# Patient Record
Sex: Male | Born: 1950 | Race: White | Hispanic: No | Marital: Married | Smoking: Never smoker
Health system: Southern US, Community
[De-identification: ages and names within clinical notes are randomized; demographics above are authoritative.]

## PROBLEM LIST (undated history)

## (undated) DIAGNOSIS — H409 Unspecified glaucoma: Secondary | ICD-10-CM

## (undated) DIAGNOSIS — T8859XA Other complications of anesthesia, initial encounter: Secondary | ICD-10-CM

## (undated) DIAGNOSIS — E785 Hyperlipidemia, unspecified: Secondary | ICD-10-CM

## (undated) DIAGNOSIS — I201 Angina pectoris with documented spasm: Secondary | ICD-10-CM

## (undated) DIAGNOSIS — M1990 Unspecified osteoarthritis, unspecified site: Secondary | ICD-10-CM

## (undated) DIAGNOSIS — K219 Gastro-esophageal reflux disease without esophagitis: Secondary | ICD-10-CM

## (undated) DIAGNOSIS — T4145XA Adverse effect of unspecified anesthetic, initial encounter: Secondary | ICD-10-CM

## (undated) DIAGNOSIS — I319 Disease of pericardium, unspecified: Secondary | ICD-10-CM

## (undated) DIAGNOSIS — T783XXA Angioneurotic edema, initial encounter: Secondary | ICD-10-CM

## (undated) HISTORY — PX: JOINT REPLACEMENT: SHX530

## (undated) HISTORY — PX: CHOLECYSTECTOMY: SHX55

## (undated) HISTORY — DX: Unspecified glaucoma: H40.9

## (undated) HISTORY — PX: SPINE SURGERY: SHX786

## (undated) HISTORY — DX: Angioneurotic edema, initial encounter: T78.3XXA

## (undated) HISTORY — DX: Hyperlipidemia, unspecified: E78.5

---

## 1979-06-30 HISTORY — PX: ANTERIOR FUSION CERVICAL SPINE: SUR626

## 2008-10-29 HISTORY — PX: OTHER SURGICAL HISTORY: SHX169

## 2010-10-29 DIAGNOSIS — I319 Disease of pericardium, unspecified: Secondary | ICD-10-CM

## 2010-10-29 HISTORY — DX: Disease of pericardium, unspecified: I31.9

## 2011-07-03 DIAGNOSIS — I201 Angina pectoris with documented spasm: Secondary | ICD-10-CM | POA: Insufficient documentation

## 2011-07-03 DIAGNOSIS — K219 Gastro-esophageal reflux disease without esophagitis: Secondary | ICD-10-CM | POA: Insufficient documentation

## 2011-10-30 HISTORY — PX: OTHER SURGICAL HISTORY: SHX169

## 2012-05-13 ENCOUNTER — Ambulatory Visit: Payer: Self-pay | Admitting: Orthopedic Surgery

## 2012-06-03 ENCOUNTER — Encounter (HOSPITAL_COMMUNITY): Payer: Self-pay | Admitting: Pharmacy Technician

## 2012-06-11 ENCOUNTER — Encounter (HOSPITAL_COMMUNITY): Payer: Self-pay

## 2012-06-11 ENCOUNTER — Encounter (HOSPITAL_COMMUNITY)
Admission: RE | Admit: 2012-06-11 | Discharge: 2012-06-11 | Disposition: A | Discharge status: Home / Self Care | Payer: Managed Care, Other (non HMO) | Source: Ambulatory Visit | Attending: Orthopedic Surgery | Admitting: Orthopedic Surgery

## 2012-06-11 ENCOUNTER — Ambulatory Visit (HOSPITAL_COMMUNITY)
Admission: RE | Admit: 2012-06-11 | Discharge: 2012-06-11 | Disposition: A | Discharge status: Home / Self Care | Payer: Managed Care, Other (non HMO) | Source: Ambulatory Visit | Attending: Orthopedic Surgery | Admitting: Orthopedic Surgery

## 2012-06-11 DIAGNOSIS — Z01818 Encounter for other preprocedural examination: Secondary | ICD-10-CM | POA: Insufficient documentation

## 2012-06-11 DIAGNOSIS — Z01812 Encounter for preprocedural laboratory examination: Secondary | ICD-10-CM | POA: Insufficient documentation

## 2012-06-11 DIAGNOSIS — E119 Type 2 diabetes mellitus without complications: Secondary | ICD-10-CM | POA: Insufficient documentation

## 2012-06-11 DIAGNOSIS — Z01810 Encounter for preprocedural cardiovascular examination: Secondary | ICD-10-CM | POA: Insufficient documentation

## 2012-06-11 HISTORY — DX: Angina pectoris with documented spasm: I20.1

## 2012-06-11 HISTORY — DX: Other complications of anesthesia, initial encounter: T88.59XA

## 2012-06-11 HISTORY — DX: Gastro-esophageal reflux disease without esophagitis: K21.9

## 2012-06-11 HISTORY — DX: Unspecified osteoarthritis, unspecified site: M19.90

## 2012-06-11 HISTORY — DX: Disease of pericardium, unspecified: I31.9

## 2012-06-11 HISTORY — DX: Adverse effect of unspecified anesthetic, initial encounter: T41.45XA

## 2012-06-11 LAB — CBC
HCT: 41.5 % (ref 39.0–52.0)
MCH: 28.1 pg (ref 26.0–34.0)
MCHC: 33.3 g/dL (ref 30.0–36.0)
MCV: 84.5 fL (ref 78.0–100.0)
RDW: 13.7 % (ref 11.5–15.5)

## 2012-06-11 LAB — URINALYSIS, ROUTINE W REFLEX MICROSCOPIC
Bilirubin Urine: NEGATIVE
Glucose, UA: NEGATIVE mg/dL
Hgb urine dipstick: NEGATIVE
Ketones, ur: NEGATIVE mg/dL
Leukocytes, UA: NEGATIVE
pH: 6.5 (ref 5.0–8.0)

## 2012-06-11 LAB — BASIC METABOLIC PANEL
BUN: 11 mg/dL (ref 6–23)
CO2: 28 mEq/L (ref 19–32)
Calcium: 9.3 mg/dL (ref 8.4–10.5)
Chloride: 99 mEq/L (ref 96–112)
Creatinine, Ser: 0.72 mg/dL (ref 0.50–1.35)
Glucose, Bld: 126 mg/dL — ABNORMAL HIGH (ref 70–99)

## 2012-06-11 NOTE — Patient Instructions (Signed)
YOUR SURGERY IS SCHEDULED ON:  Monday  8/19   AT 9:55 AM  REPORT TO [facility] AT:  7:30 AM      PHONE # FOR SHORT STAY IS [phone]  DO NOT EAT OR DRINK ANYTHING AFTER MIDNIGHT THE NIGHT BEFORE YOUR SURGERY.  YOU MAY BRUSH YOUR TEETH, RINSE OUT YOUR MOUTH--BUT NO WATER, NO FOOD, NO CHEWING GUM, NO MINTS, NO CANDIES, NO CHEWING TOBACCO.  PLEASE TAKE THE FOLLOWING MEDICATIONS THE AM OF YOUR SURGERY WITH A FEW SIPS OF WATER:  PROTONIX, VERAPAMIL    IF YOU USE INHALERS--USE YOUR INHALERS THE AM OF YOUR SURGERY AND BRING INHALERS TO THE HOSPITAL -TAKE TO SURGERY.    IF YOU ARE DIABETIC:  DO NOT TAKE ANY DIABETIC MEDICATIONS THE AM OF YOUR SURGERY.  IF YOU TAKE INSULIN IN THE EVENINGS--PLEASE ONLY TAKE 1/2 NORMAL EVENING DOSE THE NIGHT BEFORE YOUR SURGERY.  NO INSULIN THE AM OF YOUR SURGERY.  IF YOU HAVE SLEEP APNEA AND USE CPAP OR BIPAP--PLEASE BRING THE MASK --NOT THE MACHINE-NOT THE TUBING   -JUST THE MASK. DO NOT BRING VALUABLES, MONEY, CREDIT CARDS.  CONTACT LENS, DENTURES / PARTIALS, GLASSES SHOULD NOT BE WORN TO SURGERY AND IN MOST CASES-HEARING AIDS WILL NEED TO BE REMOVED.  BRING YOUR GLASSES CASE, ANY EQUIPMENT NEEDED FOR YOUR CONTACT LENS. FOR PATIENTS ADMITTED TO THE HOSPITAL--CHECK OUT TIME THE DAY OF DISCHARGE IS 11:00 AM.  ALL INPATIENT ROOMS ARE PRIVATE - WITH BATHROOM, TELEPHONE, TELEVISION AND WIFI INTERNET. IF YOU ARE BEING DISCHARGED THE SAME DAY OF YOUR SURGERY--YOU CAN NOT DRIVE YOURSELF HOME--AND SHOULD NOT GO HOME ALONE BY TAXI OR BUS.  NO DRIVING OR OPERATING MACHINERY FOR 24 HOURS FOLLOWING ANESTHESIA / PAIN MEDICATIONS.                            SPECIAL INSTRUCTIONS:  CHLORHEXIDINE SOAP SHOWER (other brand names are Betasept and Hibiclens ) PLEASE SHOWER WITH CHLORHEXIDINE THE NIGHT BEFORE YOUR SURGERY AND THE AM OF YOUR SURGERY. DO NOT USE CHLORHEXIDINE ON YOUR FACE OR PRIVATE AREAS--YOU MAY USE YOUR NORMAL SOAP THOSE AREAS AND YOUR NORMAL SHAMPOO.   WOMEN SHOULD AVOID SHAVING UNDER ARMS AND SHAVING LEGS 48 HOURS BEFORE USING CHLORHEXIDINE TO AVOID SKIN IRRITATION.  DO NOT USE IF ALLERGIC TO CHLORHEXIDINE.  PLEASE READ OVER ANY  FACT SHEETS THAT YOU WERE GIVEN: MRSA INFORMATION, BLOOD TRANSFUSION INFORMATION, INCENTIVE SPIROMETER INFORMATION.

## 2012-06-12 MED ORDER — TRANEXAMIC ACID 100 MG/ML IV SOLN: 15.0000 mg/kg | Freq: Once | INTRAVENOUS | Status: DC

## 2012-06-12 NOTE — H&P (Signed)
[name] [name] is an 61 y.o. male.    Chief Complaint:   Left knee pain and loosening of the partial prosthetic joint  HPI: Pt is a 61 y.o. male complaining of left knee pain since he had a partial knee replacement 3 years ago.  He states that original knee replacement was done in [place], [place] and the ain had continually increased as well as the feels of the knee wanting to given away.  X-rays in the clinic show left partial knee replacement.  A bone scan was also obtained with showed possible loosening of the femoral component as well as possible loose bodies on the lateral aspect.  Pt has tried various conservative treatments which have failed to alleviate their symptoms. Various options are discussed with the patient. Risks, benefits and expectations were discussed with the patient. Patient understand the risks, benefits and expectations and wishes to proceed with surgery.   PCP:  Bobby Rumpf, MD  D/C Plans:  Home with HHPT  Post-op Meds:   Rx given for ASA, Robaxin, Iron, Colace and MiraLax  Tranexamic Acid:   To be given  Decadron:   Not to be given - DM  PMH: Past Medical History  Diagnosis Date  . Complication of anesthesia     uvula swelling--day after surgery-could not swallow--required steroids--this was after knee surgery 2010  . Coronary artery spasm     diagnosed in the 7o's after chest pains--pt states heart cath negative--was given verapamil to take to prevent further spasms and pt states he has not had any other problems  . Pericarditis 2012    tx'd at [facility]--with motrin 800 mg--hospitalized over night--no problems since-  . Diabetes mellitus   . GERD (gastroesophageal reflux disease)   . Arthritis     some arthritis and djd left knee - s/p partial left knee replacement -now has pain left knee, and knee gives away    PSH: Past Surgical History  Procedure Date  . Left partial knee replacement 2010    surgery was at [facility] in [place], [place]  . Nasal septal  recontruction 2013  . Anterior fusion cervical spine 1980's    Social History:  reports that he has never smoked. He has never used smokeless tobacco. He reports that he does not drink alcohol or use illicit drugs.  Allergies:  No Known Allergies  Medications: No current facility-administered medications for this encounter.   Current Outpatient Prescriptions  Medication Sig Dispense Refill  . aspirin 81 MG chewable tablet Chew 81 mg by mouth every morning.      Marland Kitchen atorvastatin (LIPITOR) 10 MG tablet Take 10 mg by mouth every evening.       . insulin glargine (LANTUS) 100 UNIT/ML injection Inject 38 Units into the skin every morning.      Marland Kitchen lisinopril (PRINIVIL,ZESTRIL) 20 MG tablet Take 20 mg by mouth every morning.      . pantoprazole (PROTONIX) 40 MG tablet Take 40 mg by mouth every morning.      . pioglitazone (ACTOS) 45 MG tablet Take 45 mg by mouth every morning.      . sitaGLIPtan-metformin (JANUMET) 50-1000 MG per tablet Take 1 tablet by mouth 2 (two) times daily with a meal.      . verapamil (COVERA HS) 240 MG (CO) 24 hr tablet Take 240 mg by mouth every morning.         No results found for this or any previous visit (from the past 48 hour(s)). Dg Chest  2 View  06/11/2012  *RADIOLOGY REPORT*  Clinical Data: Preop for left knee surgery, diabetes  CHEST - 2 VIEW  Comparison: None.  Findings: The lungs are clear.  Mediastinal contours appear normal. The heart is borderline enlarged.  No acute bony abnormality is seen.  There is mild degenerative change in the lower thoracic spine.  IMPRESSION: No active lung disease.  Borderline cardiomegaly.  Original Report Authenticated By: Juline Patch, M.D.    ROS: Review of Systems  Constitutional: Negative.   HENT: Negative.   Eyes: Negative.   Respiratory: Negative.   Cardiovascular: Negative.   Gastrointestinal: Negative.   Genitourinary: Negative.   Musculoskeletal: Positive for myalgias and joint pain.  Skin: Negative.     Neurological: Negative.   Endo/Heme/Allergies: Negative.   Psychiatric/Behavioral: Negative.      Physical Exam: BP: 131/75 ; HR 72 ; Resp: 14 Physical Exam  Constitutional: He is oriented to person, place, and time and well-developed, well-nourished, and in no distress.  HENT:  Head: Normocephalic and atraumatic.  Nose: Nose normal.  Mouth/Throat: Oropharynx is clear and moist.  Eyes: Pupils are equal, round, and reactive to light.  Neck: Neck supple. No JVD present. No tracheal deviation present. No thyromegaly present.  Cardiovascular: Normal rate, regular rhythm, normal heart sounds and intact distal pulses.   Pulmonary/Chest: Effort normal and breath sounds normal. No respiratory distress. He has no wheezes. He has no rales. He exhibits no tenderness.  Abdominal: Soft. There is no tenderness. There is no guarding.  Musculoskeletal:       Left knee: He exhibits laceration (previous partial knee replacment scar) and bony tenderness. He exhibits normal range of motion, no swelling and no effusion. tenderness found.  Lymphadenopathy:    He has no cervical adenopathy.  Neurological: He is alert and oriented to person, place, and time.  Skin: Skin is warm and dry.  Psychiatric: Affect normal.     Assessment/Plan Assessment:  Left knee pain and loosening of the partial prosthetic joint  Plan: Patient will undergo a left knee arthroscopy with partial lateral menisectomy and a left partial knee revision-femoral component on 06/16/2012 per Dr. Charlann Boxer at [facility]. Risks benefits and expectation were discussed with the patient. Patient understand risks, benefits and expectation and wishes to proceed.   Anastasio Auerbach Raysha Tilmon   PAC  06/12/2012, 11:18 AM

## 2012-06-13 NOTE — Pre-Procedure Instructions (Addendum)
[facility] FAXED PT'S MOST RECENT CARDIOLOGY OFFICE NOTE 08/15/11 AND HIS STRESS ECHO REPORT 01/28/12 AND EKG REPORT 01/27/12  -REPORTS PLACED ON PT'S CHART. PT HAS CBC, BMET, PT, PTT, UA, EKG AND CXR DONE PREOP AT [facility] ON 06/11/12 --T/S WILL BE DRAWN DAY OF SURGERY.  PREOP TEACHING WAS DONE USING THE TEACH BACK METHOD. PT HAS A NOTE OF MEDICAL CLEARANCE FOR SURGERY FROM DR. CAREW THAT WAS FAXED BY DR. Nilsa Nutting OFFICE.--THE NOTE PLACED ON PT'S CHART

## 2012-06-16 ENCOUNTER — Encounter (HOSPITAL_COMMUNITY)
Admission: RE | Disposition: A | Discharge status: Home Health | Payer: Self-pay | Source: Ambulatory Visit | Attending: Orthopedic Surgery

## 2012-06-16 ENCOUNTER — Inpatient Hospital Stay (HOSPITAL_COMMUNITY)
Admission: RE | Admit: 2012-06-16 | Discharge: 2012-06-17 | DRG: 468 | Disposition: A | Discharge status: Home Health | Payer: Managed Care, Other (non HMO) | Source: Ambulatory Visit | Attending: Orthopedic Surgery | Admitting: Orthopedic Surgery

## 2012-06-16 ENCOUNTER — Encounter (HOSPITAL_COMMUNITY): Payer: Self-pay | Admitting: *Deleted

## 2012-06-16 ENCOUNTER — Ambulatory Visit (HOSPITAL_COMMUNITY): Payer: Managed Care, Other (non HMO) | Admitting: Anesthesiology

## 2012-06-16 ENCOUNTER — Encounter (HOSPITAL_COMMUNITY): Payer: Self-pay | Admitting: Anesthesiology

## 2012-06-16 ENCOUNTER — Ambulatory Visit (HOSPITAL_COMMUNITY): Payer: Managed Care, Other (non HMO)

## 2012-06-16 DIAGNOSIS — S83289A Other tear of lateral meniscus, current injury, unspecified knee, initial encounter: Secondary | ICD-10-CM | POA: Diagnosis present

## 2012-06-16 DIAGNOSIS — Z794 Long term (current) use of insulin: Secondary | ICD-10-CM

## 2012-06-16 DIAGNOSIS — Z96652 Presence of left artificial knee joint: Secondary | ICD-10-CM

## 2012-06-16 DIAGNOSIS — Z96659 Presence of unspecified artificial knee joint: Secondary | ICD-10-CM

## 2012-06-16 DIAGNOSIS — Z9889 Other specified postprocedural states: Secondary | ICD-10-CM

## 2012-06-16 DIAGNOSIS — K219 Gastro-esophageal reflux disease without esophagitis: Secondary | ICD-10-CM | POA: Diagnosis present

## 2012-06-16 DIAGNOSIS — M6750 Plica syndrome, unspecified knee: Secondary | ICD-10-CM | POA: Diagnosis present

## 2012-06-16 DIAGNOSIS — I209 Angina pectoris, unspecified: Secondary | ICD-10-CM | POA: Diagnosis present

## 2012-06-16 DIAGNOSIS — T84039A Mechanical loosening of unspecified internal prosthetic joint, initial encounter: Principal | ICD-10-CM | POA: Diagnosis present

## 2012-06-16 DIAGNOSIS — E119 Type 2 diabetes mellitus without complications: Secondary | ICD-10-CM | POA: Diagnosis present

## 2012-06-16 DIAGNOSIS — Z79899 Other long term (current) drug therapy: Secondary | ICD-10-CM

## 2012-06-16 DIAGNOSIS — IMO0002 Reserved for concepts with insufficient information to code with codable children: Secondary | ICD-10-CM | POA: Diagnosis present

## 2012-06-16 DIAGNOSIS — Y838 Other surgical procedures as the cause of abnormal reaction of the patient, or of later complication, without mention of misadventure at the time of the procedure: Secondary | ICD-10-CM | POA: Diagnosis present

## 2012-06-16 DIAGNOSIS — Z7982 Long term (current) use of aspirin: Secondary | ICD-10-CM

## 2012-06-16 DIAGNOSIS — M25569 Pain in unspecified knee: Secondary | ICD-10-CM | POA: Diagnosis present

## 2012-06-16 DIAGNOSIS — M1710 Unilateral primary osteoarthritis, unspecified knee: Secondary | ICD-10-CM | POA: Diagnosis present

## 2012-06-16 DIAGNOSIS — Z1883 Retained stone or crystalline fragments: Secondary | ICD-10-CM

## 2012-06-16 DIAGNOSIS — X58XXXA Exposure to other specified factors, initial encounter: Secondary | ICD-10-CM | POA: Diagnosis present

## 2012-06-16 HISTORY — PX: TOTAL KNEE REVISION: SHX996

## 2012-06-16 HISTORY — PX: FOREIGN BODY REMOVAL: SHX962

## 2012-06-16 LAB — GLUCOSE, CAPILLARY
Glucose-Capillary: 141 mg/dL — ABNORMAL HIGH (ref 70–99)
Glucose-Capillary: 173 mg/dL — ABNORMAL HIGH (ref 70–99)
Glucose-Capillary: 139 mg/dL — ABNORMAL HIGH (ref 70–99)

## 2012-06-16 LAB — TYPE AND SCREEN: ABO/RH(D): A NEG

## 2012-06-16 SURGERY — ARTHROSCOPY, KNEE, WITH LATERAL MENISCECTOMY
Anesthesia: General | Site: Knee | Laterality: Left | Wound class: Clean

## 2012-06-16 MED ORDER — VERAPAMIL HCL 240 MG (CO) PO TB24: 240.0000 mg | ORAL_TABLET | Freq: Every morning | ORAL | Status: DC

## 2012-06-16 MED ORDER — METHOCARBAMOL 100 MG/ML IJ SOLN
500.0000 mg | Freq: Four times a day (QID) | INTRAVENOUS | Status: DC | PRN
  Administered 2012-06-16: 500 mg via INTRAVENOUS
  Filled 2012-06-16: qty 5

## 2012-06-16 MED ORDER — HYDROMORPHONE HCL PF 1 MG/ML IJ SOLN: 0.5000 mg | INTRAMUSCULAR | Status: DC | PRN

## 2012-06-16 MED ORDER — DIPHENHYDRAMINE HCL 25 MG PO CAPS
25.0000 mg | ORAL_CAPSULE | Freq: Four times a day (QID) | ORAL | Status: DC | PRN
  Administered 2012-06-16 – 2012-06-17 (×2): 25 mg via ORAL
  Filled 2012-06-16 (×2): qty 1

## 2012-06-16 MED ORDER — CHLORHEXIDINE GLUCONATE 4 % EX LIQD
60.0000 mL | Freq: Once | CUTANEOUS | Status: DC
  Filled 2012-06-16: qty 60

## 2012-06-16 MED ORDER — VERAPAMIL HCL ER 240 MG PO TBCR
240.0000 mg | EXTENDED_RELEASE_TABLET | Freq: Every day | ORAL | Status: DC
  Administered 2012-06-17: 240 mg via ORAL
  Filled 2012-06-16: qty 1

## 2012-06-16 MED ORDER — METHOCARBAMOL 500 MG PO TABS
500.0000 mg | ORAL_TABLET | Freq: Four times a day (QID) | ORAL | Status: DC | PRN
  Administered 2012-06-16 – 2012-06-17 (×2): 500 mg via ORAL
  Filled 2012-06-16 (×2): qty 1

## 2012-06-16 MED ORDER — LINAGLIPTIN 5 MG PO TABS
5.0000 mg | ORAL_TABLET | Freq: Every day | ORAL | Status: DC
  Administered 2012-06-16 – 2012-06-17 (×2): 5 mg via ORAL
  Filled 2012-06-16 (×2): qty 1

## 2012-06-16 MED ORDER — KETOROLAC TROMETHAMINE 30 MG/ML IJ SOLN
INTRAMUSCULAR | Status: DC | PRN
  Administered 2012-06-16: 30 mg

## 2012-06-16 MED ORDER — KETOROLAC TROMETHAMINE 30 MG/ML IJ SOLN
INTRAMUSCULAR | Status: AC
  Filled 2012-06-16: qty 1

## 2012-06-16 MED ORDER — PROMETHAZINE HCL 25 MG/ML IJ SOLN: 6.2500 mg | INTRAMUSCULAR | Status: DC | PRN

## 2012-06-16 MED ORDER — LACTATED RINGERS IV SOLN
INTRAVENOUS | Status: DC | PRN
  Administered 2012-06-16 (×3): via INTRAVENOUS

## 2012-06-16 MED ORDER — INSULIN ASPART 100 UNIT/ML ~~LOC~~ SOLN
0.0000 [IU] | Freq: Three times a day (TID) | SUBCUTANEOUS | Status: DC
  Administered 2012-06-16: 2 [IU] via SUBCUTANEOUS
  Administered 2012-06-17: 3 [IU] via SUBCUTANEOUS

## 2012-06-16 MED ORDER — HYDROMORPHONE HCL PF 1 MG/ML IJ SOLN
INTRAMUSCULAR | Status: AC
  Filled 2012-06-16: qty 1

## 2012-06-16 MED ORDER — ACETAMINOPHEN 10 MG/ML IV SOLN
INTRAVENOUS | Status: AC
  Filled 2012-06-16: qty 100

## 2012-06-16 MED ORDER — NEOSTIGMINE METHYLSULFATE 1 MG/ML IJ SOLN
INTRAMUSCULAR | Status: DC | PRN
  Administered 2012-06-16: 3 mg via INTRAVENOUS

## 2012-06-16 MED ORDER — HYDROCODONE-ACETAMINOPHEN 7.5-325 MG PO TABS
1.0000 | ORAL_TABLET | ORAL | Status: DC
  Administered 2012-06-16 (×2): 2 via ORAL
  Administered 2012-06-16: 1 via ORAL
  Administered 2012-06-17 (×2): 2 via ORAL
  Filled 2012-06-16 (×4): qty 2
  Filled 2012-06-16: qty 1

## 2012-06-16 MED ORDER — LACTATED RINGERS IV SOLN: INTRAVENOUS | Status: DC

## 2012-06-16 MED ORDER — PIOGLITAZONE HCL 45 MG PO TABS
45.0000 mg | ORAL_TABLET | Freq: Every morning | ORAL | Status: DC
  Administered 2012-06-17: 45 mg via ORAL
  Filled 2012-06-16: qty 1

## 2012-06-16 MED ORDER — POLYETHYLENE GLYCOL 3350 17 G PO PACK: 17.0000 g | PACK | Freq: Two times a day (BID) | ORAL | Status: DC

## 2012-06-16 MED ORDER — ACETAMINOPHEN 10 MG/ML IV SOLN
INTRAVENOUS | Status: DC | PRN
  Administered 2012-06-16: 1000 mg via INTRAVENOUS

## 2012-06-16 MED ORDER — ATORVASTATIN CALCIUM 10 MG PO TABS
10.0000 mg | ORAL_TABLET | Freq: Every evening | ORAL | Status: DC
  Administered 2012-06-16: 10 mg via ORAL
  Filled 2012-06-16 (×2): qty 1

## 2012-06-16 MED ORDER — BUPIVACAINE-EPINEPHRINE PF 0.25-1:200000 % IJ SOLN
INTRAMUSCULAR | Status: DC | PRN
  Administered 2012-06-16: 50 mL

## 2012-06-16 MED ORDER — RIVAROXABAN 10 MG PO TABS
10.0000 mg | ORAL_TABLET | ORAL | Status: DC
  Administered 2012-06-17: 10 mg via ORAL
  Filled 2012-06-16 (×2): qty 1

## 2012-06-16 MED ORDER — PROPOFOL 10 MG/ML IV BOLUS
INTRAVENOUS | Status: DC | PRN
  Administered 2012-06-16: 200 mg via INTRAVENOUS

## 2012-06-16 MED ORDER — BISACODYL 10 MG RE SUPP: 10.0000 mg | Freq: Every day | RECTAL | Status: DC | PRN

## 2012-06-16 MED ORDER — LACTATED RINGERS IR SOLN
Status: DC | PRN
  Administered 2012-06-16: 9000 mL

## 2012-06-16 MED ORDER — METOCLOPRAMIDE HCL 10 MG PO TABS: 5.0000 mg | ORAL_TABLET | Freq: Three times a day (TID) | ORAL | Status: DC | PRN

## 2012-06-16 MED ORDER — PHENOL 1.4 % MT LIQD
1.0000 | OROMUCOSAL | Status: DC | PRN
  Filled 2012-06-16: qty 177

## 2012-06-16 MED ORDER — SODIUM CHLORIDE 0.9 % IV SOLN
INTRAVENOUS | Status: DC
  Administered 2012-06-16 – 2012-06-17 (×2): via INTRAVENOUS
  Filled 2012-06-16 (×4): qty 1000

## 2012-06-16 MED ORDER — CEFAZOLIN SODIUM-DEXTROSE 2-3 GM-% IV SOLR
2.0000 g | INTRAVENOUS | Status: AC
  Administered 2012-06-16: 2 g via INTRAVENOUS

## 2012-06-16 MED ORDER — CEFAZOLIN SODIUM-DEXTROSE 2-3 GM-% IV SOLR
2.0000 g | Freq: Four times a day (QID) | INTRAVENOUS | Status: AC
  Administered 2012-06-16 (×2): 2 g via INTRAVENOUS
  Filled 2012-06-16 (×2): qty 50

## 2012-06-16 MED ORDER — GLYCOPYRROLATE 0.2 MG/ML IJ SOLN
INTRAMUSCULAR | Status: DC | PRN
  Administered 2012-06-16: 0.4 mg via INTRAVENOUS

## 2012-06-16 MED ORDER — 0.9 % SODIUM CHLORIDE (POUR BTL) OPTIME
TOPICAL | Status: DC | PRN
  Administered 2012-06-16: 1000 mL

## 2012-06-16 MED ORDER — ONDANSETRON HCL 4 MG/2ML IJ SOLN
INTRAMUSCULAR | Status: DC | PRN
  Administered 2012-06-16: 4 mg via INTRAVENOUS

## 2012-06-16 MED ORDER — METOCLOPRAMIDE HCL 5 MG/ML IJ SOLN: 5.0000 mg | Freq: Three times a day (TID) | INTRAMUSCULAR | Status: DC | PRN

## 2012-06-16 MED ORDER — HYDROMORPHONE HCL PF 1 MG/ML IJ SOLN
INTRAMUSCULAR | Status: DC | PRN
  Administered 2012-06-16: 0.5 mg via INTRAVENOUS
  Administered 2012-06-16: 1 mg via INTRAVENOUS
  Administered 2012-06-16: 0.5 mg via INTRAVENOUS

## 2012-06-16 MED ORDER — FLEET ENEMA 7-19 GM/118ML RE ENEM: 1.0000 | ENEMA | Freq: Once | RECTAL | Status: AC | PRN

## 2012-06-16 MED ORDER — LIDOCAINE HCL (CARDIAC) 20 MG/ML IV SOLN
INTRAVENOUS | Status: DC | PRN
  Administered 2012-06-16: 100 mg via INTRAVENOUS

## 2012-06-16 MED ORDER — MIDAZOLAM HCL 5 MG/5ML IJ SOLN
INTRAMUSCULAR | Status: DC | PRN
  Administered 2012-06-16 (×2): 1 mg via INTRAVENOUS

## 2012-06-16 MED ORDER — BUPIVACAINE-EPINEPHRINE 0.25% -1:200000 IJ SOLN
INTRAMUSCULAR | Status: AC
  Filled 2012-06-16: qty 1

## 2012-06-16 MED ORDER — CEFAZOLIN SODIUM-DEXTROSE 2-3 GM-% IV SOLR
INTRAVENOUS | Status: AC
  Filled 2012-06-16: qty 50

## 2012-06-16 MED ORDER — ZOLPIDEM TARTRATE 5 MG PO TABS: 5.0000 mg | ORAL_TABLET | Freq: Every evening | ORAL | Status: DC | PRN

## 2012-06-16 MED ORDER — HYDROMORPHONE HCL PF 1 MG/ML IJ SOLN
0.2500 mg | INTRAMUSCULAR | Status: DC | PRN
  Administered 2012-06-16 (×2): 0.25 mg via INTRAVENOUS

## 2012-06-16 MED ORDER — ONDANSETRON HCL 4 MG PO TABS: 4.0000 mg | ORAL_TABLET | Freq: Four times a day (QID) | ORAL | Status: DC | PRN

## 2012-06-16 MED ORDER — PANTOPRAZOLE SODIUM 40 MG PO TBEC
40.0000 mg | DELAYED_RELEASE_TABLET | Freq: Every day | ORAL | Status: DC
  Administered 2012-06-17: 40 mg via ORAL
  Filled 2012-06-16: qty 1

## 2012-06-16 MED ORDER — ALUM & MAG HYDROXIDE-SIMETH 200-200-20 MG/5ML PO SUSP: 30.0000 mL | ORAL | Status: DC | PRN

## 2012-06-16 MED ORDER — FERROUS SULFATE 325 (65 FE) MG PO TABS
325.0000 mg | ORAL_TABLET | Freq: Three times a day (TID) | ORAL | Status: DC
  Administered 2012-06-16 – 2012-06-17 (×2): 325 mg via ORAL
  Filled 2012-06-16 (×5): qty 1

## 2012-06-16 MED ORDER — METFORMIN HCL 500 MG PO TABS
1000.0000 mg | ORAL_TABLET | Freq: Two times a day (BID) | ORAL | Status: DC
  Administered 2012-06-16 – 2012-06-17 (×2): 1000 mg via ORAL
  Filled 2012-06-16 (×4): qty 2

## 2012-06-16 MED ORDER — SITAGLIPTIN PHOS-METFORMIN HCL 50-1000 MG PO TABS: 1.0000 | ORAL_TABLET | Freq: Two times a day (BID) | ORAL | Status: DC

## 2012-06-16 MED ORDER — TRANEXAMIC ACID 100 MG/ML IV SOLN
1520.0000 mg | Freq: Once | INTRAVENOUS | Status: AC
  Administered 2012-06-16: 1520 mg via INTRAVENOUS
  Filled 2012-06-16: qty 15.2

## 2012-06-16 MED ORDER — CELECOXIB 200 MG PO CAPS
200.0000 mg | ORAL_CAPSULE | Freq: Two times a day (BID) | ORAL | Status: DC
  Administered 2012-06-16 – 2012-06-17 (×2): 200 mg via ORAL
  Filled 2012-06-16 (×3): qty 1

## 2012-06-16 MED ORDER — MENTHOL 3 MG MT LOZG
1.0000 | LOZENGE | OROMUCOSAL | Status: DC | PRN
  Filled 2012-06-16: qty 9

## 2012-06-16 MED ORDER — ROCURONIUM BROMIDE 100 MG/10ML IV SOLN
INTRAVENOUS | Status: DC | PRN
  Administered 2012-06-16: 50 mg via INTRAVENOUS

## 2012-06-16 MED ORDER — FENTANYL CITRATE 0.05 MG/ML IJ SOLN
INTRAMUSCULAR | Status: DC | PRN
  Administered 2012-06-16 (×2): 50 ug via INTRAVENOUS
  Administered 2012-06-16: 100 ug via INTRAVENOUS
  Administered 2012-06-16: 50 ug via INTRAVENOUS

## 2012-06-16 MED ORDER — ONDANSETRON HCL 4 MG/2ML IJ SOLN: 4.0000 mg | Freq: Four times a day (QID) | INTRAMUSCULAR | Status: DC | PRN

## 2012-06-16 MED ORDER — DOCUSATE SODIUM 100 MG PO CAPS
100.0000 mg | ORAL_CAPSULE | Freq: Two times a day (BID) | ORAL | Status: DC
  Administered 2012-06-16 – 2012-06-17 (×2): 100 mg via ORAL

## 2012-06-16 SURGICAL SUPPLY — 76 items
BAG ZIPLOCK 12X15 (MISCELLANEOUS) ×3 IMPLANT
BANDAGE ELASTIC 6 VELCRO ST LF (GAUZE/BANDAGES/DRESSINGS) ×3 IMPLANT
BANDAGE ESMARK 6X9 LF (GAUZE/BANDAGES/DRESSINGS) ×2 IMPLANT
BEARING MENISCAL TIBIAL 6 MD L (Orthopedic Implant) ×3 IMPLANT
BLADE CUDA SHAVER 3.5 (BLADE) ×3 IMPLANT
BLADE SAW RECIPROCATING 77.5 (BLADE) ×3 IMPLANT
BLADE SAW SGTL 13.0X1.19X90.0M (BLADE) ×3 IMPLANT
BLADE SAW SGTL 81X20 HD (BLADE) ×3 IMPLANT
BNDG ESMARK 6X9 LF (GAUZE/BANDAGES/DRESSINGS) ×3
BONE CEMENT GENTAMICIN (Cement) ×3 IMPLANT
BOWL SMART MIX CTS (DISPOSABLE) IMPLANT
CLOTH BEACON ORANGE TIMEOUT ST (SAFETY) ×3 IMPLANT
CUFF TOURN SGL QUICK 34 (TOURNIQUET CUFF) ×1
CUFF TRNQT CYL 34X4X40X1 (TOURNIQUET CUFF) ×2 IMPLANT
DECANTER SPIKE VIAL GLASS SM (MISCELLANEOUS) ×3 IMPLANT
DERMABOND ADVANCED (GAUZE/BANDAGES/DRESSINGS) ×1
DERMABOND ADVANCED .7 DNX12 (GAUZE/BANDAGES/DRESSINGS) ×2 IMPLANT
DRAPE EXTREMITY T 121X128X90 (DRAPE) ×3 IMPLANT
DRAPE OEC MINIVIEW 54X84 (DRAPES) ×3 IMPLANT
DRAPE POUCH INSTRU U-SHP 10X18 (DRAPES) ×3 IMPLANT
DRAPE U-SHAPE 47X51 STRL (DRAPES) ×3 IMPLANT
DRSG ADAPTIC 3X8 NADH LF (GAUZE/BANDAGES/DRESSINGS) IMPLANT
DRSG AQUACEL AG ADV 3.5X 4 (GAUZE/BANDAGES/DRESSINGS) ×3 IMPLANT
DRSG AQUACEL AG ADV 3.5X10 (GAUZE/BANDAGES/DRESSINGS) ×3 IMPLANT
DRSG AQUACEL AG ADV 3.5X14 (GAUZE/BANDAGES/DRESSINGS) ×3 IMPLANT
DRSG EMULSION OIL 3X3 NADH (GAUZE/BANDAGES/DRESSINGS) ×3 IMPLANT
DRSG PAD ABDOMINAL 8X10 ST (GAUZE/BANDAGES/DRESSINGS) ×3 IMPLANT
DRSG TEGADERM 4X4.75 (GAUZE/BANDAGES/DRESSINGS) ×3 IMPLANT
DURAPREP 26ML APPLICATOR (WOUND CARE) ×3 IMPLANT
ELECT REM PT RETURN 9FT ADLT (ELECTROSURGICAL) ×3
ELECTRODE REM PT RTRN 9FT ADLT (ELECTROSURGICAL) ×2 IMPLANT
EVACUATOR 1/8 PVC DRAIN (DRAIN) ×3 IMPLANT
FACESHIELD LNG OPTICON STERILE (SAFETY) ×15 IMPLANT
GAUZE SPONGE 2X2 8PLY STRL LF (GAUZE/BANDAGES/DRESSINGS) ×2 IMPLANT
GLOVE BIOGEL PI IND STRL 7.5 (GLOVE) ×2 IMPLANT
GLOVE BIOGEL PI IND STRL 8 (GLOVE) ×4 IMPLANT
GLOVE BIOGEL PI INDICATOR 7.5 (GLOVE) ×1
GLOVE BIOGEL PI INDICATOR 8 (GLOVE) ×2
GLOVE ECLIPSE 8.0 STRL XLNG CF (GLOVE) ×3 IMPLANT
GLOVE ORTHO TXT STRL SZ7.5 (GLOVE) ×6 IMPLANT
GLOVE SURG ORTHO 8.0 STRL STRW (GLOVE) ×3 IMPLANT
GOWN BRE IMP PREV XXLGXLNG (GOWN DISPOSABLE) ×6 IMPLANT
GOWN STRL NON-REIN LRG LVL3 (GOWN DISPOSABLE) ×3 IMPLANT
HANDPIECE INTERPULSE COAX TIP (DISPOSABLE) ×1
IMMOBILIZER KNEE 20 (SOFTGOODS)
IMMOBILIZER KNEE 20 THIGH 36 (SOFTGOODS) IMPLANT
KIT BASIN OR (CUSTOM PROCEDURE TRAY) ×3 IMPLANT
MANIFOLD NEPTUNE II (INSTRUMENTS) ×6 IMPLANT
NDL SAFETY ECLIPSE 18X1.5 (NEEDLE) ×2 IMPLANT
NEEDLE HYPO 18GX1.5 SHARP (NEEDLE) ×1
NS IRRIG 1000ML POUR BTL (IV SOLUTION) ×3 IMPLANT
PACK ARTHROSCOPY WL (CUSTOM PROCEDURE TRAY) ×3 IMPLANT
PACK TOTAL JOINT (CUSTOM PROCEDURE TRAY) ×3 IMPLANT
PADDING CAST COTTON 6X4 STRL (CAST SUPPLIES) ×6 IMPLANT
PEG TWIN FEM CEMENTED MED (Knees) ×3 IMPLANT
POSITIONER SURGICAL ARM (MISCELLANEOUS) ×3 IMPLANT
SET ARTHROSCOPY TUBING (MISCELLANEOUS) ×1
SET ARTHROSCOPY TUBING LN (MISCELLANEOUS) ×2 IMPLANT
SET HNDPC FAN SPRY TIP SCT (DISPOSABLE) ×2 IMPLANT
SET PAD KNEE POSITIONER (MISCELLANEOUS) ×3 IMPLANT
SPONGE GAUZE 2X2 STER 10/PKG (GAUZE/BANDAGES/DRESSINGS) ×1
SPONGE GAUZE 4X4 12PLY (GAUZE/BANDAGES/DRESSINGS) ×6 IMPLANT
SPONGE LAP 18X18 X RAY DECT (DISPOSABLE) ×3 IMPLANT
STAPLER VISISTAT 35W (STAPLE) IMPLANT
SUCTION FRAZIER 12FR DISP (SUCTIONS) ×3 IMPLANT
SUT ETHILON 4 0 PS 2 18 (SUTURE) ×3 IMPLANT
SUT MNCRL AB 4-0 PS2 18 (SUTURE) ×3 IMPLANT
SUT VIC AB 1 CT1 36 (SUTURE) ×9 IMPLANT
SUT VIC AB 2-0 CT1 27 (SUTURE) ×3
SUT VIC AB 2-0 CT1 TAPERPNT 27 (SUTURE) ×6 IMPLANT
SYR 50ML LL SCALE MARK (SYRINGE) ×3 IMPLANT
TOWEL OR 17X26 10 PK STRL BLUE (TOWEL DISPOSABLE) ×6 IMPLANT
TOWER CARTRIDGE SMART MIX (DISPOSABLE) ×3 IMPLANT
TRAY FOLEY CATH 14FRSI W/METER (CATHETERS) ×3 IMPLANT
WATER STERILE IRR 1500ML POUR (IV SOLUTION) ×3 IMPLANT
WRAP KNEE MAXI GEL POST OP (GAUZE/BANDAGES/DRESSINGS) ×3 IMPLANT

## 2012-06-16 NOTE — Anesthesia Postprocedure Evaluation (Signed)
Anesthesia Post Note  Patient: [name] [name]  Procedure(s) Performed: Procedure(s) (LRB): KNEE ARTHROSCOPY WITH LATERAL MENISECTOMY (Left) TOTAL KNEE REVISION (Left) REMOVAL FOREIGN BODY EXTREMITY (Left)  Anesthesia type: General  Patient location: PACU  Post pain: Pain level controlled  Post assessment: Post-op Vital signs reviewed  Last Vitals:  Filed Vitals:   06/16/12 1415  BP: 145/72  Pulse: 77  Temp:   Resp: 14    Post vital signs: Reviewed  Level of consciousness: sedated  Complications: No apparent anesthesia complications

## 2012-06-16 NOTE — Anesthesia Preprocedure Evaluation (Signed)
Anesthesia Evaluation  Patient identified by MRN, date of birth, ID band Patient awake    Reviewed: Allergy & Precautions, H&P , NPO status , Patient's Chart, lab work & pertinent test results  Airway Mallampati: III TM Distance: >3 FB Neck ROM: Full    Dental  (+) Poor Dentition and Dental Advisory Given,    Pulmonary neg pulmonary ROS,  breath sounds clear to auscultation  Pulmonary exam normal       Cardiovascular hypertension, + CAD Rhythm:Regular Rate:Normal  Prinzmetal angina   Neuro/Psych negative neurological ROS  negative psych ROS   GI/Hepatic negative GI ROS, Neg liver ROS, GERD-  ,  Endo/Other  negative endocrine ROSType 2, Oral Hypoglycemic AgentsMorbid obesity  Renal/GU negative Renal ROS  negative genitourinary   Musculoskeletal negative musculoskeletal ROS (+)   Abdominal   Peds  Hematology negative hematology ROS (+)   Anesthesia Other Findings   Reproductive/Obstetrics negative OB ROS                           Anesthesia Physical Anesthesia Plan  ASA: III  Anesthesia Plan: General   Post-op Pain Management:    Induction: Intravenous  Airway Management Planned: Oral ETT  Additional Equipment:   Intra-op Plan:   Post-operative Plan: Extubation in OR  Informed Consent: I have reviewed the patients History and Physical, chart, labs and discussed the procedure including the risks, benefits and alternatives for the proposed anesthesia with the patient or authorized representative who has indicated his/her understanding and acceptance.   Dental advisory given  Plan Discussed with: CRNA  Anesthesia Plan Comments:         Anesthesia Quick Evaluation

## 2012-06-16 NOTE — Interval H&P Note (Signed)
History and Physical Interval Note:  06/16/2012 9:44 AM  [name] [name]  has presented today for surgery, with the diagnosis of Painful Left partial knee Replacement  The various methods of treatment have been discussed with the patient and family. After consideration of risks, benefits and other options for treatment, the patient has consented to  Procedure(s) (LRB): KNEE ARTHROSCOPY WITH LATERAL MENISECTOMY (Left) TOTAL KNEE REVISION (Left)  as a surgical intervention .  The patient's history has been reviewed, patient examined, no change in status, stable for surgery.  I have reviewed the patient's chart and labs.  Questions were answered to the patient's satisfaction.     Shelda Pal

## 2012-06-16 NOTE — Brief Op Note (Signed)
06/16/2012  12:51 PM  PATIENT:  [name] [name]  61 y.o. male  PRE-OPERATIVE DIAGNOSIS:  Painful Left partial knee Replacement  POST-OPERATIVE DIAGNOSIS:  1.  Painful Left partial knee Replacement, 2. Lateral meniscal tear, anterior/mid body, 3. Retained cement fragments  PROCEDURE:  Procedure(s) (LRB): 1.  KNEE ARTHROSCOPY WITH partial LATERAL MENISECTOMY (Left),  2.  Left partial knee REVISION (Left), femoral component and polyethylene insert (medium twin peg femur, size 6 insert) 3.  REMOVAL FOREIGN BODY, retained cement fragments (4-76mm  X 2)  EXTREMITY (Left)  SURGEON:  Surgeon(s) and Role:    * Shelda Pal, MD - Primary  PHYSICIAN ASSISTANT: Lanney Gins, PA-C  ANESTHESIA:   general  EBL:  Total I/O In: 2000 [I.V.:2000] Out: 900 [Urine:750; Blood:150]  BLOOD ADMINISTERED:none  DRAINS: (1 medium) Hemovact drain(s) in the left knee with  Suction Open   LOCAL MEDICATIONS USED:  MARCAINE     SPECIMEN:  No Specimen  DISPOSITION OF SPECIMEN:  N/A  COUNTS:  YES  TOURNIQUET:   Total Tourniquet Time Documented: Thigh (Left) - 60 minutes  DICTATION: .Other Dictation: Dictation Number [id number]  PLAN OF CARE: Admit for overnight observation  PATIENT DISPOSITION:  PACU - hemodynamically stable.   Delay start of Pharmacological VTE agent (>24hrs) due to surgical blood loss or risk of bleeding: no

## 2012-06-16 NOTE — Progress Notes (Signed)
Orthopedic Tech Progress Note Patient Details:  [name] [name] 1951/04/20 [id number]  Patient ID: [name] [name], male   DOB: 07-Jun-1951, 61 y.o.   MRN: [id number] Viewed order from rn order list  [name] [name] 06/16/2012, 4:51 PM

## 2012-06-16 NOTE — Transfer of Care (Signed)
Immediate Anesthesia Transfer of Care Note  Patient: [name] [name]  Procedure(s) Performed: Procedure(s) (LRB): KNEE ARTHROSCOPY WITH LATERAL MENISECTOMY (Left) TOTAL KNEE REVISION (Left) REMOVAL FOREIGN BODY EXTREMITY (Left)  Patient Location: PACU  Anesthesia Type: General  Level of Consciousness: awake, alert  and oriented  Airway & Oxygen Therapy: Patient Spontanous Breathing and Patient connected to face mask oxygen  Post-op Assessment: Report given to PACU RN and Post -op Vital signs reviewed and stable  Post vital signs: Reviewed and stable  Complications: No apparent anesthesia complications

## 2012-06-16 NOTE — Progress Notes (Signed)
Orthopedic Tech Progress Note Patient Details:  [name] [name] 1951/01/18 [id number]  Patient ID: [name] [name], male   DOB: 1950/11/06, 61 y.o.   MRN: [id number] Trapeze bar patient helper  Nikki Dom 06/16/2012, 4:50 PM

## 2012-06-17 LAB — BASIC METABOLIC PANEL
BUN: 7 mg/dL (ref 6–23)
CO2: 30 mEq/L (ref 19–32)
Chloride: 102 mEq/L (ref 96–112)
GFR calc Af Amer: >90 mL/min (ref >90)
Glucose, Bld: 132 mg/dL — ABNORMAL HIGH (ref 70–99)
Potassium: 4.1 mEq/L (ref 3.5–5.1)

## 2012-06-17 LAB — CBC
HCT: 36.8 % — ABNORMAL LOW (ref 39.0–52.0)
Hemoglobin: 12.4 g/dL — ABNORMAL LOW (ref 13.0–17.0)
RBC: 4.28 MIL/uL (ref 4.22–5.81)
WBC: 9.7 10*3/uL (ref 4.0–10.5)

## 2012-06-17 MED ORDER — METHOCARBAMOL 500 MG PO TABS: 500.0000 mg | ORAL_TABLET | Freq: Four times a day (QID) | ORAL | Status: AC | PRN

## 2012-06-17 MED ORDER — DSS 100 MG PO CAPS: 100.0000 mg | ORAL_CAPSULE | Freq: Two times a day (BID) | ORAL | Status: AC

## 2012-06-17 MED ORDER — POLYETHYLENE GLYCOL 3350 17 G PO PACK: 17.0000 g | PACK | Freq: Two times a day (BID) | ORAL | Status: AC

## 2012-06-17 MED ORDER — FERROUS SULFATE 325 (65 FE) MG PO TABS: 325.0000 mg | ORAL_TABLET | Freq: Three times a day (TID) | ORAL | Status: DC

## 2012-06-17 MED ORDER — DIPHENHYDRAMINE HCL 25 MG PO CAPS: 25.0000 mg | ORAL_CAPSULE | Freq: Four times a day (QID) | ORAL | Status: DC | PRN

## 2012-06-17 MED ORDER — HYDROCODONE-ACETAMINOPHEN 7.5-325 MG PO TABS: 1.0000 | ORAL_TABLET | ORAL | Status: AC | PRN

## 2012-06-17 MED ORDER — ASPIRIN EC 325 MG PO TBEC: 325.0000 mg | DELAYED_RELEASE_TABLET | Freq: Two times a day (BID) | ORAL | Status: AC

## 2012-06-17 NOTE — Progress Notes (Signed)
   Subjective: 1 Day Post-Op Procedure(s) (LRB): KNEE ARTHROSCOPY WITH LATERAL MENISECTOMY (Left) TOTAL KNEE REVISION (Left) REMOVAL FOREIGN BODY EXTREMITY (Left)   Patient reports pain as mild, pain well controlled. No events throughout the night. Ready to be discharged home.  Objective:   VITALS:   Filed Vitals:   06/17/12 0512  BP: 149/84  Pulse: 67  Temp: 98.1 F (36.7 C)  Resp: 14    Neurovascular intact Dorsiflexion/Plantar flexion intact Incision: dressing C/D/I No cellulitis present Compartment soft  LABS  Basename 06/17/12 0420  HGB 12.4*  HCT 36.8*  WBC 9.7  PLT 202     Basename 06/17/12 0420  NA 139  K 4.1  BUN 7  CREATININE 0.88  GLUCOSE 132*     Assessment/Plan: 1 Day Post-Op Procedure(s) (LRB): KNEE ARTHROSCOPY WITH LATERAL MENISECTOMY (Left) TOTAL KNEE REVISION (Left) REMOVAL FOREIGN BODY EXTREMITY (Left)   HV drain d/c'ed Foley cath d/c'ed Advance diet Up with therapy D/C IV fluids Discharge home with home health Follow up in 2 weeks at [facility].  Follow-up Information    Follow up with OLIN,Golden Gilreath D in 2 weeks.   Contact information:   [facility] [address] [place] [address] [phone]          Anastasio Auerbach. Damyah Gugel   PAC  06/17/2012, 7:46 AM

## 2012-06-17 NOTE — Progress Notes (Signed)
Utilization review completed.  

## 2012-06-17 NOTE — Op Note (Signed)
NAMECHRYSTIAN, [name] [name] NO.:  [id number]  MEDICAL RECORD NO.:  [id number]  LOCATION:  1620                         FACILITY:  [facility]  PHYSICIAN:  Madlyn Frankel. Charlann Boxer, M.D.  DATE OF BIRTH:  12/21/1950  DATE OF PROCEDURE:  06/16/2012 DATE OF DISCHARGE:                              OPERATIVE REPORT   PREOPERATIVE DIAGNOSES:  Painful left partial knee replacement with concern for internal derangement with mechanical symptoms with radiographic evidence supporting two large retained cement particles; one anterior and one posterior, questionable lateral meniscal tear.  POSTOPERATIVE DIAGNOSES/FINDINGS: 1. Painful left partial knee replacement with loose femoral component. 2. Lateral meniscal tear to the anterior and anterior horn and midbody     portion. 3. Scarring over the anterior medial aspect of the knee consistent     with plica. 4. Retained cement fragments measuring from 4 close to 10 mm in length     and two of them, one posterior and one anterior.  PROCEDURES: 1. Left knee diagnostic and operative arthroscopy with a lateral     partial meniscectomy involving the anterior horn and midbody     portion as well as minor chondroplasty in lateral compartment and     synovectomy. 2. Left partial knee revision involving the femoral component, which     was noted to be loose revising it to a Biomet partial Oxford medium-     sized twin pegged femur with a size 6 insert to match the medium     femur. 3. Removal of two large retained foreign bodies of cement utilizing     fluoroscopy for identification and confirmation of removal, one     posterior and one anterior.  SURGEON:  Madlyn Frankel. Charlann Boxer, MD  ASSISTANT:  Lanney Gins, PA-C.  Please note that [name] [name] was present for the entirety of the case, utilized for preoperative position, general facilitation of the case, management of the extremity during the procedure as well as primary wound closure.  ANESTHESIA:   General.  SPECIMENS:  None.  COMPLICATIONS:  None.  BLOOD LOSS:  Less than 150 mL.  TOURNIQUET TIME:  60 minutes at 250 mmHg.  INDICATIONS FOR PROCEDURE:  [name] [name] is a 61 year old gentleman with a history of left partial knee replacement that was performed approximately 4-5 years ago from an outside facility.  He was doing well until recently noting mechanical symptoms and pain in his knee.  Given the difficulty in identifying internal derangement, MRI was not ordered. We had a lengthy discussion regarding options.  He did have these two visible cement fragments, which was uncertain whether or not they were mobile or not versus being containing soft tissues.  I did do a bone scan preoperatively due to the amount of pain he was having and identifying some increased uptake around the femoral component consistent with loosening.  Workup for infection was otherwise negative. He had no fusion and presentation of warmth and no clinical concern for infection.  After reviewing [name] [name] and his current situation, he was ready at this point to proceed with surgical intervention.  His symptoms were affected his quality of life.  We discussed converting him to  a total knee replacement versus preserving the functionality of a partial knee replacement with the above listed procedures.  He wished his point to preserve his ACL and functional capacity of the knee and revised the femoral component with removal of cement fragments and scoping.  Risks of infection and DVT were discussed as well as importantly the failure of this type of surgery and need for future revision to a total knee replacement.  Consent was obtained for above.  PROCEDURE IN DETAIL:  The patient was brought to the operative theater. Once adequate anesthesia and preoperative antibiotics, Ancef 2 g administered, the patient was positioned in supine.  His left eye tourniquet was placed.  The left leg was placed into a leg  holder for arthroscopic surgery as well as we then performed a revision surgery with the foot of the bed dropped.  Time-out was performed identifying the patient, planned procedure, and extremity.  The left lower extremity was prepped and draped in sterile fashion from the ankle to the tourniquet and leg holder.  Initially, the procedure was started out as a scope, the lateral portal was made followed by the medial incision that can be utilized excising the old incision and then extended it proximal and slightly distal.  The inferior medial portal was utilized as a sole working portal.  I was able to remove and perform meniscectomy, partial meniscectomy in the anterior horn and midbody of the meniscus as well as remove chondral fragments in this area.  There was no evidence of any eburnated bone or advanced degenerative changes.  The patellofemoral joint had minor chondral fraying, which was debrided back, but not significant, no evidence any advanced degenerative changes there.  I did identify significant scarring over the anterior medial aspect of the knee consistent with the plica versus scarring.  Though I did debride a significant portion of this vessel.  Also, I felt that from an open standpoint, it would be debride this better with a complete synovectomy.  Once I had performed the examination of the knee, I was unable to find any obvious loose cement fragments.  I examined the knee couple of different times to make certain and nothing became evident.  We thus removed the arthroscopic instruments after taking the appropriate photos before and after.  At this point, I made a median arthrotomy, I removed the synovium over the medial and scarring over the medial aspect of the knee consistent with the findings that remained from the arthroscopic synovectomy.  This was extended from the superior medial aspect down into the anterior aspect of the knee.  Once the knee was opened  and exposed including a slight proximal medial peel for exposure purposes only, the old polyethylene insert was removed.  There was no evidence any significant abrasion where this poly.  I then was able to remove the femoral component very easily just a couple taps on the medial border of it and it was removed.  I then spent time removing remaining cement from the central peg as well as the other drill holes that were made into the distal femur.  Once this was adequately debrided, I went ahead and prepared the distal femur for this new twin peg component.  We were able to place the old the jig in place utilizing the tibial component for setting rotation. The anterior drill hole was made for the twin peg.  At this point, I did a trial reduction, was happy with the twin peg rotation as well as with  a size 6 insert.  The trial component was removed.  I irrigated the knee to see if the cement fragments become free or they did not.  I then brought in the mini C-arm, draped this out sterilely and brought it over the knee due to lateral radiographs.  I was able to identify the large posterior cement fragment and removed it and what I felt to be in total, other radiographs did not reveal any obvious cement fragments posteriorly.  Anteriorly, I was able to identify this fragment, which was on the anterolateral aspect of the infrapatellar region and it was removed.  I did confirm this under radiographic observation and interpretation.  Once this was removed and I was satisfied that these were gone, the knee was re-irrigated with normal saline solution with pulse lavage.  Cement was mixed.  Final components were opened.  The final femoral component was then cemented into the femur and held at 45 degrees of flexion with a size 6 Feeler gauge in place.  Excessive cement was removed.  Once the cement had fully hardened, the excess cement was removed from the knee as best it could be visualized  including the posterior aspect of the knee.  The final size 6 insert to match the medium femur for this left medial knee was and then snapped into place.  Following irrigation, the tourniquet was let down after 60 minutes.  We injected the synovial capsule junction in the anteromedial to medial aspect of knee with 60 mL of 0.25% Marcaine with epinephrine.  A medium Hemovac drain was placed deep.  The extensor mechanism was then reapproximated using a combination of #1 Vicryl and 0 V-Loc suture.  The remainder of the wound was closed with 2-0 Vicryl and running 4-0 Monocryl.  The knee was then cleaned, dried and dressed sterilely using Dermabond and Aquacel dressing.  He was then extubated and brought to the recovery room in stable condition tolerating the procedure well.     Madlyn Frankel Charlann Boxer, M.D.     MDO/MEDQ  D:  06/16/2012  T:  06/17/2012  Job:  [id number]

## 2012-06-17 NOTE — Discharge Summary (Signed)
Physician Discharge Summary  Patient ID: [name] [name] MRN: [id number] DOB/AGE: 62-Jan-1952 62 y.o.  Admit date: 06/16/2012 Discharge date:  06/17/2012  Procedures:  Procedure(s) (LRB): KNEE ARTHROSCOPY WITH LATERAL MENISECTOMY (Left) TOTAL KNEE REVISION (Left) REMOVAL FOREIGN BODY EXTREMITY (Left)  Attending Physician:  Dr. Durene Romans   Admission Diagnoses:   Left knee pain and loosening of the partial prosthetic joint  Discharge Diagnoses:  Principal Problem:  *S/P left [place] revision Active Problems:  S/P left knee arthroscopy   Coronary artery spasm   Pericarditis Diabetes mellitus   GERD (gastroesophageal reflux disease)   Arthritis    HPI: Pt is a 61 y.o. male complaining of left knee pain since he had a partial knee replacement 3 years ago. He states that original knee replacement was done in [place], [place] and the ain had continually increased as well as the feels of the knee wanting to given away. X-rays in the clinic show left partial knee replacement. A bone scan was also obtained with showed possible loosening of the femoral component as well as possible loose bodies on the lateral aspect. Pt has tried various conservative treatments which have failed to alleviate their symptoms. Various options are discussed with the patient. Risks, benefits and expectations were discussed with the patient. Patient understand the risks, benefits and expectations and wishes to proceed with surgery.  PCP: Bobby Rumpf, MD   Discharged Condition: good  Hospital Course:  Patient underwent the above stated procedure on 06/16/2012. Patient tolerated the procedure well and brought to the recovery room in good condition and subsequently to the floor.  POD #1 BP: 149/84 ; Pulse: 67 ; Temp: 98.1 F (36.7 C) ; Resp: 14  Pt's foley was removed, as well as the hemovac drain removed. IV was changed to a saline lock. Patient reports pain as mild, pain well controlled. No events throughout the night.  Ready to be discharged home. Neurovascular intact, dorsiflexion/plantar flexion intact, incision: dressing C/D/I, no cellulitis present and compartment soft.   LABS  Basename  06/17/12 0420   HGB  12.4  HCT  36.8    Discharge Exam: General appearance: alert, cooperative and no distress Extremities: Homans sign is negative, no sign of DVT, no edema, redness or tenderness in the calves or thighs and no ulcers, gangrene or trophic changes  Disposition: Home  with follow up in 2 weeks   Follow-up Information    Follow up with Shelda Pal, MD. Schedule an appointment as soon as possible for a visit in 2 weeks.   Contact information:   [facility] [address], [address] [place] [address] [phone]          Discharge Orders    Future Orders Please Complete By Expires   Call MD / Call 911      Comments:   If you experience chest pain or shortness of breath, CALL 911 and be transported to the hospital emergency room.  If you develope a fever above 101 F, pus (white drainage) or increased drainage or redness at the wound, or calf pain, call your surgeon's office.   Discharge instructions      Comments:   Maintain surgical dressing for 8 days, then replace with gauze and tape. Keep the area dry and clean until follow up. Follow up in 2 weeks at [facility]. Call with any questions or concerns.   Constipation Prevention      Comments:   Drink plenty of fluids.  Prune juice may  be helpful.  You may use a stool softener, such as Colace (over the counter) 100 mg twice a day.  Use MiraLax (over the counter) for constipation as needed.   Increase activity slowly as tolerated      Driving restrictions      Comments:   No driving for 4 weeks   TED hose      Comments:   Use stockings (TED hose) for 2 weeks on both leg(s).  You may remove them at night for sleeping.   Change dressing      Comments:   Maintain surgical dressing for 8  days, then change the dressing daily with sterile 4 x 4 inch gauze dressing and tape. Keep the area dry and clean.      Current Discharge Medication List    START taking these medications   Details  aspirin EC 325 MG tablet Take 1 tablet (325 mg total) by mouth 2 (two) times daily. X 4 weeks Qty: 60 tablet, Refills: 0    diphenhydrAMINE (BENADRYL) 25 mg capsule Take 1 capsule (25 mg total) by mouth every 6 (six) hours as needed for itching, allergies or sleep. Qty: 30 capsule    docusate sodium 100 MG CAPS Take 100 mg by mouth 2 (two) times daily. Qty: 10 capsule    ferrous sulfate 325 (65 FE) MG tablet Take 1 tablet (325 mg total) by mouth 3 (three) times daily after meals.    HYDROcodone-acetaminophen (NORCO) 7.5-325 MG per tablet Take 1-2 tablets by mouth every 4 (four) hours as needed for pain. Qty: 120 tablet, Refills: 0    methocarbamol (ROBAXIN) 500 MG tablet Take 1 tablet (500 mg total) by mouth every 6 (six) hours as needed (muscle spasms).    polyethylene glycol (MIRALAX / GLYCOLAX) packet Take 17 g by mouth 2 (two) times daily. Qty: 14 each      CONTINUE these medications which have NOT CHANGED   Details  atorvastatin (LIPITOR) 10 MG tablet Take 10 mg by mouth every evening.     insulin glargine (LANTUS) 100 UNIT/ML injection Inject 38 Units into the skin every morning.    lisinopril (PRINIVIL,ZESTRIL) 20 MG tablet Take 20 mg by mouth every morning.    pantoprazole (PROTONIX) 40 MG tablet Take 40 mg by mouth every morning.    pioglitazone (ACTOS) 45 MG tablet Take 45 mg by mouth every morning.    sitaGLIPtan-metformin (JANUMET) 50-1000 MG per tablet Take 1 tablet by mouth 2 (two) times daily with a meal.    verapamil (COVERA HS) 240 MG (CO) 24 hr tablet Take 240 mg by mouth every morning.       STOP taking these medications     aspirin 81 MG chewable tablet Comments:  Reason for Stopping:           Signed: Anastasio Auerbach. Armari Fussell   PAC  06/17/2012, 8:22  AM

## 2012-06-17 NOTE — Evaluation (Signed)
Physical Therapy One Time Evaluation Patient Details Name: [name] [name] MRN: [id number] DOB: 11-16-1950 Today's Date: 06/17/2012 Time: 5621-3086 PT Time Calculation (min): 17 min  PT Assessment / Plan / Recommendation Clinical Impression  Pt s/p L [place] revision.  Pt doing very well with mobility.  Pt able to ambulate 400 feet with RW and performed exercises.  Pt ready for d/c today.  No further acute care needs identifed.    PT Assessment  All further PT needs can be met in the next venue of care    Follow Up Recommendations  Home health PT    Barriers to Discharge        Equipment Recommendations  None recommended by PT    Recommendations for Other Services     Frequency      Precautions / Restrictions Precautions Precautions: Knee Precaution Comments: Pt able to perform 10 SLR without lag Required Braces or Orthoses: Knee Immobilizer - Left Knee Immobilizer - Left: Discontinue once straight leg raise with < 10 degree lag Restrictions Weight Bearing Restrictions: No LLE Weight Bearing: Weight bearing as tolerated   Pertinent Vitals/Pain 5/10 L knee, ice applied      Mobility  Bed Mobility Bed Mobility: Supine to Sit;Sit to Supine Supine to Sit: 6: Modified independent (Device/Increase time);HOB elevated Sit to Supine: 6: Modified independent (Device/Increase time);HOB elevated Transfers Transfers: Sit to Stand;Stand to Sit Sit to Stand: 5: Supervision Stand to Sit: 5: Supervision Details for Transfer Assistance: verbal cues for hand placement with RW Ambulation/Gait Ambulation/Gait Assistance: 5: Supervision Ambulation Distance (Feet): 400 Feet Assistive device: Rolling walker Ambulation/Gait Assistance Details: pt not using UEs on RW very much so will likely progress to cane quickly, pt able to describe gait pattern using SPC correctly Gait Pattern: Step-through pattern;Decreased stride length;Antalgic Stairs: Yes Stairs Assistance: 5: Supervision Stairs  Assistance Details (indicate cue type and reason): verbal cue for sequence Stair Management Technique: Two rails;Step to pattern;Forwards Number of Stairs: 2     Exercises Total Joint Exercises Short Arc Quad: AROM;Strengthening;Left;20 reps;Supine Heel Slides: AROM;Left;15 reps;Seated Hip ABduction/ADduction: AROM;Strengthening;Left;20 reps;Supine Straight Leg Raises: AROM;Strengthening;Left;10 reps;Supine Long Arc Quad: AROM;Strengthening;Left;10 reps;Seated   PT Diagnosis: Abnormality of gait  PT Problem List:   PT Treatment Interventions:     PT Goals    Visit Information  Last PT Received On: 06/17/12 Assistance Needed: +1    Subjective Data  Subjective: I hope to leave today.   Prior Functioning  Home Living Lives With: Alone Available Help at Discharge: Available PRN/intermittently (aunt) Type of Home: House Home Access: Stairs to enter Secretary/administrator of Steps: 5 Entrance Stairs-Rails: Can reach both Home Layout: One level Home Adaptive Equipment: Walker - rolling;Straight cane Prior Function Level of Independence: Independent Communication Communication: No difficulties    Cognition  Overall Cognitive Status: Appears within functional limits for tasks assessed/performed Arousal/Alertness: Awake/alert Orientation Level: Appears intact for tasks assessed Behavior During Session: [facility] for tasks performed    Extremity/Trunk Assessment Right Upper Extremity Assessment RUE ROM/Strength/Tone: [facility] for tasks assessed Left Upper Extremity Assessment LUE ROM/Strength/Tone: WFL for tasks assessed Right Lower Extremity Assessment RLE ROM/Strength/Tone: [facility] for tasks assessed Left Lower Extremity Assessment LLE ROM/Strength/Tone: Deficits LLE ROM/Strength/Tone Deficits: L knee AROM -5-100* , good quad contraction   Balance    End of Session PT - End of Session Activity Tolerance: Patient tolerated treatment well Patient left: in bed;with call bell/phone  within reach Nurse Communication: Mobility status  GP     [name],KATHrine E 06/17/2012, 10:59 AM  Pager: [phone]

## 2012-06-17 NOTE — Care Management Note (Signed)
    Page 1 of 2   06/17/2012     11:08:16 AM   CARE MANAGEMENT NOTE 06/17/2012  Patient:  [name] [name]   Account Number:  [id number]  Date Initiated:  06/17/2012  Documentation initiated by:  Colleen Can  Subjective/Objective Assessment:   dx painful left partial knee replacemnt;left partial knee revision on day of admission     Action/Plan:   CM spoke with patient. Plans are for patient to return to his home in [place] where aunt will be caregiver. Already has DME, including RW. Wants HH agecy in network   Anticipated DC Date:  06/17/2012   Anticipated DC Plan:  HOME W HOME HEALTH SERVICES  In-house referral  NA      DC Planning Services  CM consult      [facility] Choice  HOME HEALTH   Choice offered to / List presented to:  C-1 Patient   DME arranged  NA      DME agency  NA     HH arranged  HH-2 PT      HH agency  [facility]   Status of service:  Completed, signed off Medicare Important Message given?  NO (If response is "NO", the following Medicare IM given date fields will be blank) Date Medicare IM given:   Date Additional Medicare IM given:    Discharge Disposition:  HOME W HOME HEALTH SERVICES  Per UR Regulation:    If discussed at Long Length of Stay Meetings, dates discussed:    Comments:  06/17/2012 Raynelle Bring BSN CCM [phone] [facility] can provide HHpt in [place] [facility] and is in [place] with patient's insurance. HH services to start tomorrow 06/18/2012.

## 2012-06-18 ENCOUNTER — Emergency Department (HOSPITAL_COMMUNITY)
Admission: EM | Admit: 2012-06-18 | Discharge: 2012-06-19 | Disposition: A | Discharge status: Home / Self Care | Payer: Managed Care, Other (non HMO) | Attending: Emergency Medicine | Admitting: Emergency Medicine

## 2012-06-18 ENCOUNTER — Encounter (HOSPITAL_COMMUNITY): Payer: Self-pay | Admitting: Emergency Medicine

## 2012-06-18 DIAGNOSIS — Z96659 Presence of unspecified artificial knee joint: Secondary | ICD-10-CM | POA: Insufficient documentation

## 2012-06-18 DIAGNOSIS — Z5189 Encounter for other specified aftercare: Secondary | ICD-10-CM

## 2012-06-18 DIAGNOSIS — IMO0002 Reserved for concepts with insufficient information to code with codable children: Secondary | ICD-10-CM

## 2012-06-18 DIAGNOSIS — I2510 Atherosclerotic heart disease of native coronary artery without angina pectoris: Secondary | ICD-10-CM | POA: Insufficient documentation

## 2012-06-18 DIAGNOSIS — Y838 Other surgical procedures as the cause of abnormal reaction of the patient, or of later complication, without mention of misadventure at the time of the procedure: Secondary | ICD-10-CM | POA: Insufficient documentation

## 2012-06-18 DIAGNOSIS — Z79899 Other long term (current) drug therapy: Secondary | ICD-10-CM | POA: Insufficient documentation

## 2012-06-18 DIAGNOSIS — E119 Type 2 diabetes mellitus without complications: Secondary | ICD-10-CM | POA: Insufficient documentation

## 2012-06-18 DIAGNOSIS — K219 Gastro-esophageal reflux disease without esophagitis: Secondary | ICD-10-CM | POA: Insufficient documentation

## 2012-06-18 MED ORDER — "THROMBI-PAD 3""X3"" EX PADS"
1.0000 | MEDICATED_PAD | Freq: Once | CUTANEOUS | Status: AC
  Administered 2012-06-19: 1 via TOPICAL
  Filled 2012-06-18: qty 1

## 2012-06-18 NOTE — ED Provider Notes (Signed)
History     CSN: [id number]  Arrival date & time 06/18/12  2154   First MD Initiated Contact with Patient 06/18/12 2304      Chief Complaint  Patient presents with  . Wound Check    (Consider location/radiation/quality/duration/timing/severity/associated sxs/prior treatment) HPI Comments: Pt with hx of CAD, S/P left knee surgery including, left knee arthroscopy done within a week comes in with cc  of bleeding from the knee. Pt states that the knee started oozing some blood out this evening. He used to towels to clean up the blood, and decided to call Orthopedic clinic and was advised to come to the ER. There is no increased swelling, increased pain, no n/v/f/c. Pt has seen no pus oozing out. No trauma. Pt is not on anticoagulants. No hx of bleeding disorder.   Patient is a 61 y.o. male presenting with wound check. The history is provided by the patient and medical records.  Wound Check     Past Medical History  Diagnosis Date  . Complication of anesthesia     uvula swelling--day after surgery-could not swallow--required steroids--this was after knee surgery 2010  . Coronary artery spasm     diagnosed in the 7o's after chest pains--pt states heart cath negative--was given verapamil to take to prevent further spasms and pt states he has not had any other problems  . Pericarditis 2012    tx'd at [facility]--with motrin 800 mg--hospitalized over night--no problems since-  . Diabetes mellitus   . GERD (gastroesophageal reflux disease)   . Arthritis     some arthritis and djd left knee - s/p partial left knee replacement -now has pain left knee, and knee gives away    Past Surgical History  Procedure Date  . Left partial knee replacement 2010    surgery was at [facility] in [place], [place]  . Nasal septal recontruction 2013  . Anterior fusion cervical spine 1980's  . Joint replacement     No family history on file.  History  Substance Use Topics  . Smoking status:  Never Smoker   . Smokeless tobacco: Never Used  . Alcohol Use: No      Review of Systems  Constitutional: Negative for activity change and appetite change.  Respiratory: Negative for cough and shortness of breath.   Cardiovascular: Negative for chest pain.  Gastrointestinal: Negative for abdominal pain.  Genitourinary: Negative for dysuria.  Skin: Positive for wound.  Hematological: Does not bruise/bleed easily.    Allergies  Azithromycin  Home Medications   Current Outpatient Rx  Name Route Sig Dispense Refill  . ASPIRIN EC 325 MG PO TBEC Oral Take 1 tablet (325 mg total) by mouth 2 (two) times daily. X 4 weeks 60 tablet 0  . ATORVASTATIN CALCIUM 10 MG PO TABS Oral Take 10 mg by mouth every evening.     Marland Kitchen DIPHENHYDRAMINE HCL 25 MG PO CAPS Oral Take 1 capsule (25 mg total) by mouth every 6 (six) hours as needed for itching, allergies or sleep. 30 capsule   . DSS 100 MG PO CAPS Oral Take 100 mg by mouth 2 (two) times daily. 10 capsule   . FERROUS SULFATE 325 (65 FE) MG PO TABS Oral Take 1 tablet (325 mg total) by mouth 3 (three) times daily after meals.    Marland Kitchen HYDROCODONE-ACETAMINOPHEN 7.5-325 MG PO TABS Oral Take 1-2 tablets by mouth every 4 (four) hours as needed for pain. 120 tablet 0  . INSULIN GLARGINE 100 UNIT/ML [place] SOLN  Subcutaneous Inject 38 Units into the skin every morning.    Marland Kitchen LISINOPRIL 20 MG PO TABS Oral Take 20 mg by mouth every morning.    Marland Kitchen METHOCARBAMOL 500 MG PO TABS Oral Take 1 tablet (500 mg total) by mouth every 6 (six) hours as needed (muscle spasms).    Marland Kitchen PANTOPRAZOLE SODIUM 40 MG PO TBEC Oral Take 40 mg by mouth every morning.    Marland Kitchen PIOGLITAZONE HCL 45 MG PO TABS Oral Take 45 mg by mouth every morning.    Marland Kitchen POLYETHYLENE GLYCOL 3350 PO PACK Oral Take 17 g by mouth 2 (two) times daily. 14 each   . SITAGLIPTIN-METFORMIN HCL 50-1000 MG PO TABS Oral Take 1 tablet by mouth 2 (two) times daily with a meal.    . VERAPAMIL HCL ER (CO) 240 MG PO TB24 Oral Take 240 mg  by mouth every morning.       BP 162/85  Pulse 94  Temp 97.3 F (36.3 C) (Oral)  Resp 20  Ht 5\' 11"  (1.803 m)  Wt 223 lb (101.152 kg)  BMI 31.10 kg/m2  SpO2 97%  Physical Exam  Nursing note and vitals reviewed. Constitutional: He is oriented to person, place, and time. He appears well-developed.  HENT:  Head: Normocephalic and atraumatic.  Eyes: Conjunctivae and EOM are normal. Pupils are equal, round, and reactive to light.  Neck: Normal range of motion. Neck supple.  Cardiovascular: Normal rate and regular rhythm.   Pulmonary/Chest: Effort normal and breath sounds normal.  Abdominal: Soft. Bowel sounds are normal. He exhibits no distension. There is no tenderness. There is no rebound and no guarding.  Musculoskeletal:       Left knee is swollen, erythematous with dressing. Upon removal of the dressing, dark blood was noted under the bandaid pad. At the inferior end of the wound, there is a small area which was having a very slow bleed. Dressing that was placed in the ED hour prior to my arrival - had not soaked in blood at all, nor were any of the 4x4 soaked. Distal pulses intact.  Neurological: He is alert and oriented to person, place, and time.  Skin: Skin is warm.    ED Course  Procedures (including critical care time)  Labs Reviewed - No data to display No results found.   1. Visit for wound check   2. Post-op bleeding       MDM  A/P: Pt with recent knee surgery comes in with cc of wound check. No evidence of arterial bleed, and no evidence of infection. We will place a thrombin pad up top. I really dont think a stitch is required, as surgical stitching appears very appropriate. Will apply dressing.        Derwood Kaplan, MD 06/18/12 [phone]

## 2012-06-18 NOTE — ED Notes (Signed)
Pt reports pain rated 3/10 to left knee only with bending. Denies any chest pain or sob. Pt is A/O x4. Skin warm and dry. Respirations even and unlabored. NAD noted at this time.

## 2012-06-18 NOTE — ED Notes (Signed)
Pt alert, arrives from home, c/o bleeding from wound, pt recently had left knee sx, resp even unlabored, skin pwd, contacted on call, instructed to come to ED

## 2012-06-19 ENCOUNTER — Encounter (HOSPITAL_COMMUNITY): Payer: Self-pay

## 2012-06-26 ENCOUNTER — Encounter (HOSPITAL_COMMUNITY): Payer: Self-pay | Admitting: Orthopedic Surgery

## 2012-12-29 IMAGING — CT NUCLEAR MEDICINE THREE PHASE BONE SCAN
1 of 4 series · 1 of 19 positions shown · non-contrast
Comparison: none

REASON FOR EXAM: Left Pain with Replacement Eval Loosening
COMMENTS:

PROCEDURE:     NM  - NM BONE IMAGING 3 PHASE STUDY  - May 13, 2012  [DATE]
RESULT:     Comparison: None.
Radiopharmaceutical: 23.764 mCi technetium 99m MDP
TECHNIQUE: After initial injection of the radiopharmaceutical, dynamic
angiographic planar images were obtained of the bilateral knees. Then
anterior and posterior plane planar blood pool and 3 hour delayed images
were obtained of the knees. SPECT CT images were also obtained of the
bilateral knees at the 3 hour delayed time point.

[Series 1000: bone (recon - ac ) · 4.8mm · 4.80mm/px · 1 of 78 frames shown]
[frame 46/78]
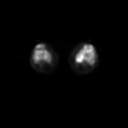

[1 of 19 positions shown; findings below may reference images not displayed]

FINDINGS: On the dynamic angiographic phase images, there may be minimally
asymmetrically increased activity in the medial left knee. On the blood pole
images there is mild radiotracer accumulation in the medial aspect of the
left knee, which is asymmetric to the right. On the 3 hour delayed images,
there is focal increased radiotracer activity along the interface of the
medial femoral condyle and the medial femoral hardware. This is greatest
along the anterior weight-bearing interface. On the sagittal images, there
is a small lucent interval between the anterior aspect of the femoral
hardware and the remainder of the anterior femoral condyle. Correlation with
outside prior radiographs is recommended. There is mild radiotracer
accumulation in the medial tibial plateau, at the interface with the medial
tibial component. This is nonspecific given its mild degree of activity.

There is a small left knee effusion. There is a small, 7 mm density just
anterior to the left femoral condyle which could represent a small loose
body. A smaller fragment is seen just anterior to the lateral tibial
plateau. These are of uncertain composition, but appear to be denser than
calcium. There are a few small ossific densities along the medial
retinaculum, likely sequela of old prior trauma.
IMPRESSION: 1. Focal increased radiotracer activity at the inferior weight-bearing
interface of the medial femoral condyle and medial femoral hardware raises
the possibility of hardware loosening in the appropriate clinical setting.
Hardware infection cannot be excluded by this study. Clinical correlation is
recommended.
2. There are 2 small densities in the lateral aspect of the knee which may
represent small loose bodies. These are of uncertain composition, but appear
to be denser than typical calcium.
3. There is a small lucent interval between the anterior aspect of the
femoral hardware and the remainder of the anterior medial femoral condyle,
as seen on the sagittal views. This may be postoperative. Recommend
comparison with outside prior radiographs.

## 2013-01-27 IMAGING — CR DG CHEST 2V
2 series · 2 of 2 positions shown · non-contrast
Comparison: None.

CLINICAL DATA: Preop for left knee surgery, diabetes

CHEST - 2 VIEW

[w chest pa]
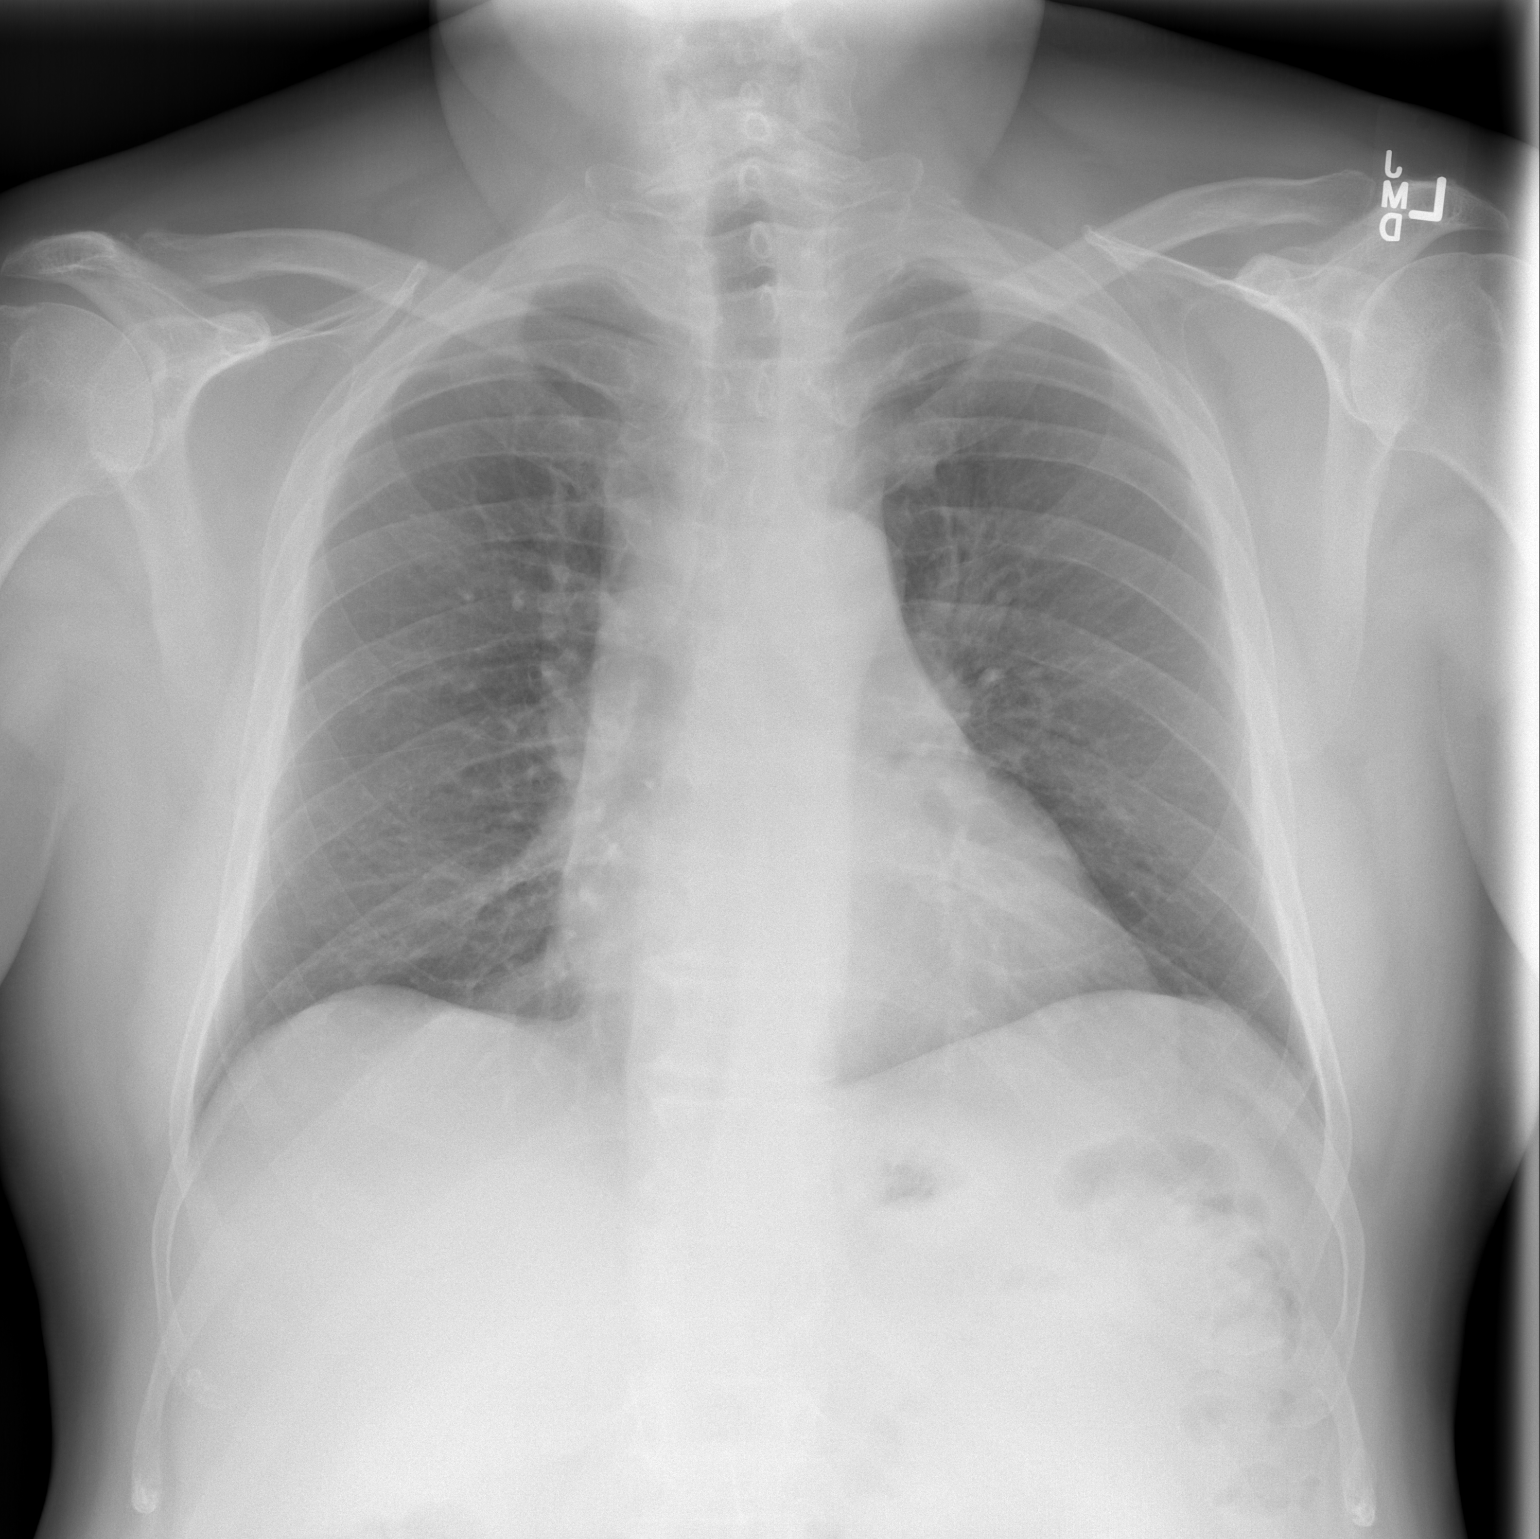

[w chest lat]
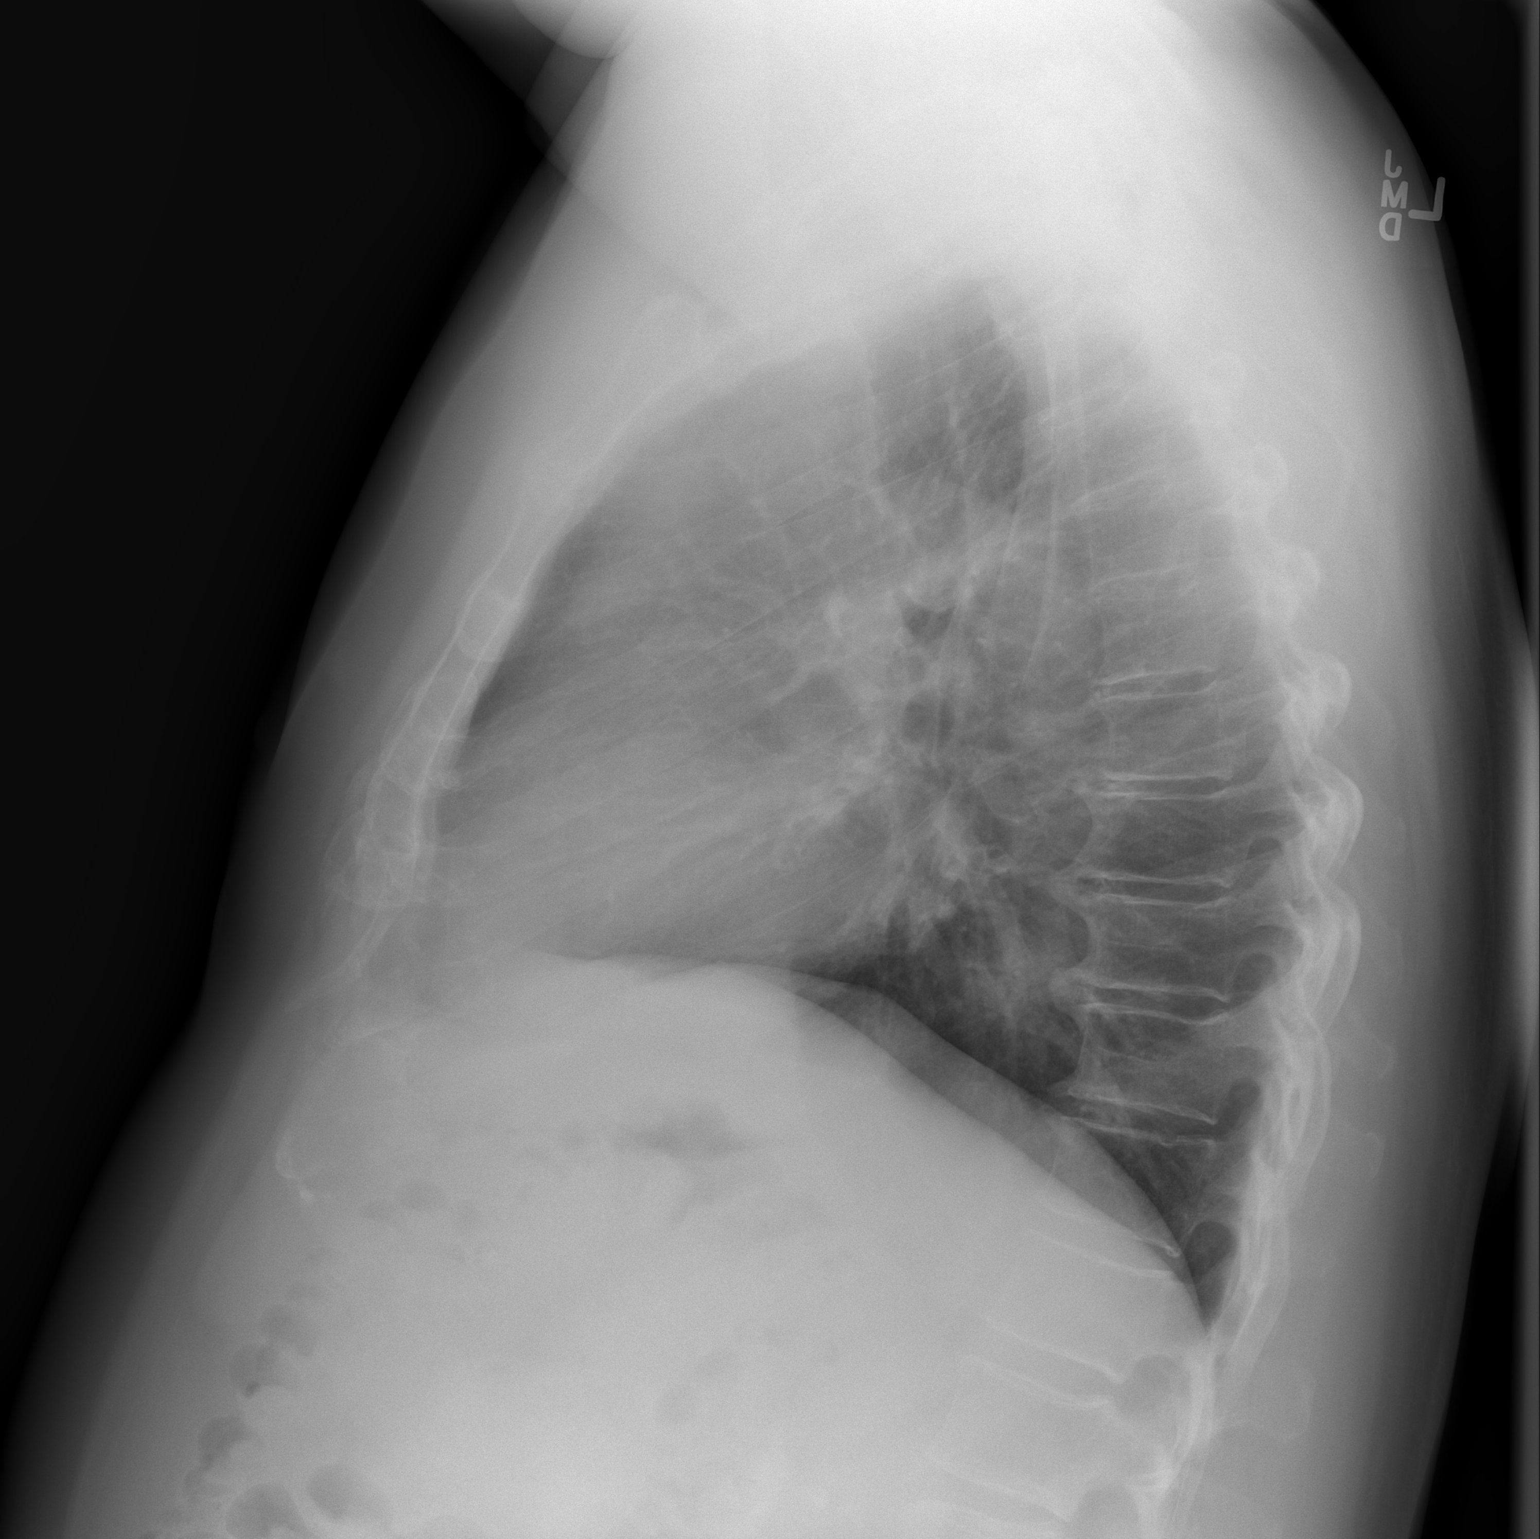

[2 of 2 positions shown; findings below may reference images not displayed]

FINDINGS: The lungs are clear.  Mediastinal contours appear normal.
The heart is borderline enlarged.  No acute bony abnormality is
seen.  There is mild degenerative change in the lower thoracic
spine.
IMPRESSION: No active lung disease.  Borderline cardiomegaly.

## 2013-10-22 ENCOUNTER — Emergency Department: Payer: Self-pay | Admitting: Emergency Medicine

## 2013-10-22 LAB — URINALYSIS, COMPLETE
Glucose,UR: >=500 mg/dL (ref 0–75)
Nitrite: NEGATIVE
Ph: 5 (ref 4.5–8.0)

## 2013-10-22 LAB — COMPREHENSIVE METABOLIC PANEL
Alkaline Phosphatase: 72 U/L
BUN: 15 mg/dL (ref 7–18)
Bilirubin,Total: 0.5 mg/dL (ref 0.2–1.0)
Chloride: 101 mmol/L (ref 98–107)
Co2: 26 mmol/L (ref 21–32)
EGFR (African American): >60
Glucose: 317 mg/dL — ABNORMAL HIGH (ref 65–99)
Osmolality: 283 (ref 275–301)
Total Protein: 7 g/dL (ref 6.4–8.2)

## 2013-10-22 LAB — CBC
HCT: 43.4 % (ref 40.0–52.0)
HGB: 14.5 g/dL (ref 13.0–18.0)
MCV: 82 fL (ref 80–100)
RBC: 5.3 10*6/uL (ref 4.40–5.90)
RDW: 14.5 % (ref 11.5–14.5)
WBC: 11.1 10*3/uL — ABNORMAL HIGH (ref 3.8–10.6)

## 2013-11-02 ENCOUNTER — Ambulatory Visit: Payer: Self-pay | Admitting: Urology

## 2013-11-02 DIAGNOSIS — N200 Calculus of kidney: Secondary | ICD-10-CM | POA: Insufficient documentation

## 2013-11-02 DIAGNOSIS — N23 Unspecified renal colic: Secondary | ICD-10-CM | POA: Insufficient documentation

## 2013-11-02 DIAGNOSIS — N1330 Unspecified hydronephrosis: Secondary | ICD-10-CM | POA: Insufficient documentation

## 2013-12-14 ENCOUNTER — Ambulatory Visit: Payer: Self-pay | Admitting: Urology

## 2013-12-14 DIAGNOSIS — N201 Calculus of ureter: Secondary | ICD-10-CM | POA: Insufficient documentation

## 2013-12-22 ENCOUNTER — Ambulatory Visit: Payer: Self-pay | Admitting: Urology

## 2013-12-22 LAB — CBC WITH DIFFERENTIAL/PLATELET
BASOS ABS: 0 10*3/uL (ref 0.0–0.1)
BASOS PCT: 0.6 %
EOS PCT: 1.3 %
Eosinophil #: 0.1 10*3/uL (ref 0.0–0.7)
HCT: 44.2 % (ref 40.0–52.0)
HGB: 14.8 g/dL (ref 13.0–18.0)
Lymphocyte #: 2.3 10*3/uL (ref 1.0–3.6)
Lymphocyte %: 28.8 %
MCH: 27.7 pg (ref 26.0–34.0)
MCHC: 33.5 g/dL (ref 32.0–36.0)
MCV: 83 fL (ref 80–100)
Monocyte #: 0.8 x10 3/mm (ref 0.2–1.0)
Monocyte %: 9.9 %
NEUTROS ABS: 4.8 10*3/uL (ref 1.4–6.5)
Neutrophil %: 59.4 %
PLATELETS: 201 10*3/uL (ref 150–440)
RBC: 5.35 10*6/uL (ref 4.40–5.90)
RDW: 15.3 % — ABNORMAL HIGH (ref 11.5–14.5)
WBC: 8 10*3/uL (ref 3.8–10.6)

## 2013-12-22 LAB — BASIC METABOLIC PANEL
ANION GAP: 8 (ref 7–16)
BUN: 16 mg/dL (ref 7–18)
CHLORIDE: 104 mmol/L (ref 98–107)
CREATININE: 0.98 mg/dL (ref 0.60–1.30)
Calcium, Total: 9.4 mg/dL (ref 8.5–10.1)
Co2: 27 mmol/L (ref 21–32)
EGFR (African American): >60
EGFR (Non-African Amer.): >60
GLUCOSE: 143 mg/dL — AB (ref 65–99)
OSMOLALITY: 281 (ref 275–301)
POTASSIUM: 4.4 mmol/L (ref 3.5–5.1)
Sodium: 139 mmol/L (ref 136–145)

## 2013-12-29 ENCOUNTER — Ambulatory Visit: Payer: Self-pay | Admitting: Urology

## 2014-08-12 DIAGNOSIS — N138 Other obstructive and reflux uropathy: Secondary | ICD-10-CM | POA: Insufficient documentation

## 2014-08-12 DIAGNOSIS — R361 Hematospermia: Secondary | ICD-10-CM | POA: Insufficient documentation

## 2014-08-12 DIAGNOSIS — R339 Retention of urine, unspecified: Secondary | ICD-10-CM | POA: Insufficient documentation

## 2014-10-20 ENCOUNTER — Encounter: Payer: Self-pay | Admitting: Nutrition

## 2014-10-20 ENCOUNTER — Encounter: Payer: BC Managed Care – PPO | Attending: "Endocrinology | Admitting: Nutrition

## 2014-10-20 VITALS — Ht 71.0 in | Wt 216.0 lb

## 2014-10-20 DIAGNOSIS — Z713 Dietary counseling and surveillance: Secondary | ICD-10-CM | POA: Diagnosis not present

## 2014-10-20 DIAGNOSIS — E669 Obesity, unspecified: Secondary | ICD-10-CM

## 2014-10-20 DIAGNOSIS — E118 Type 2 diabetes mellitus with unspecified complications: Secondary | ICD-10-CM | POA: Diagnosis not present

## 2014-10-20 DIAGNOSIS — Z6830 Body mass index (BMI) 30.0-30.9, adult: Secondary | ICD-10-CM | POA: Insufficient documentation

## 2014-10-20 DIAGNOSIS — Z794 Long term (current) use of insulin: Secondary | ICD-10-CM | POA: Insufficient documentation

## 2014-10-20 DIAGNOSIS — IMO0002 Reserved for concepts with insufficient information to code with codable children: Secondary | ICD-10-CM

## 2014-10-20 DIAGNOSIS — E1165 Type 2 diabetes mellitus with hyperglycemia: Secondary | ICD-10-CM

## 2014-10-20 NOTE — Patient Instructions (Signed)
Goals:  Follow Diabetes Meal Plan as instructed  Eat 3 meals spread out 4-5 hrs apart about the same time every day.  Avoid snacks between meals  Limit carbohydrate intake to 45-60  grams carbohydrate/snack  Add lean protein foods to meals  Monitor glucose levels as instructed by your doctor  Aim for 45-60 mins of physical activity daily  Bring food record and glucose log to your next nutrition visit Goals: 1. Aim to 7.5% in three months 2. 3-4 lbs per month 3. Follow the Plate Method watching portion sizes. Read food labels and measure foods out.

## 2014-10-20 NOTE — Progress Notes (Signed)
  Medical Nutrition Therapy:  Appt start time: 1100 end time:  1200.   Assessment:  Primary concerns today: Diabetes. Lives by himself. Eats mostly at home. Avoids fried foods. Exercise with dog and going to the gym- 3 times per week. A1C was 8.0%. Interested in losing weight and improving his blood sugars. Didn't bring his blood sugar log. Tests his blood sugars 4 times per day. On 60 units of Toujeo daily and 14 units of Humalog plus sliding scale.   Preference for learning:    No preference indicated   Learning Readiness:   Ready  Change in progress   MEDICATIONS:    DIETARY INTAKE:   24-hr recall:  B ( AM): Egg or bacon biscuit, water or diet soda  Snk ( AM): none L ( PM): 2 chicken enchilidas and rice, water Snk ( PM):  D ( PM): [place] 6" and fries, water Snk ( PM):  Beverages: water  Usual physical activity: Goes to the gym three times per week.  Estimated energy needs: 1800 calories 200 g carbohydrates 135 g protein 50 g fat  Progress Towards Goal(s):  In progress.   Nutritional Diagnosis:  NB-1.1 Food and nutrition-related knowledge deficit As related to Diabetes.  As evidenced by A1C >8%..    Intervention:  Nutrition counseling for diabetes and weight loss on diet, exercises, portion sizes, meal planning, target ranges for BS and prevention of complications from DM. Goals:  Follow Diabetes Meal Plan as instructed  Eat 3 meals spread out 4-5 hrs apart about the same time every day.  Avoid snacks between meals  Limit carbohydrate intake to 45-60  grams carbohydrate/snack  Add lean protein foods to meals  Monitor glucose levels as instructed by your doctor  Aim for 45-60 mins of physical activity daily  Bring food record and glucose log to your next nutrition visit Goals: 1. Aim to 7.5% in three months 2. 3-4 lbs per month 3. Follow the Plate Method watching portion sizes. Read food labels and measure foods out.   Teaching Method Utilized:   Visual Auditory Hands on  Handouts given during visit include:  The  Plate Method and Meal Plan Card  Barriers to learning/adherence to lifestyle change: none  Demonstrated degree of understanding via:  Teach Back   Monitoring/Evaluation:  Dietary intake, exercise, meal planning, SBG, and body weight in 1 month(s).

## 2014-12-01 ENCOUNTER — Ambulatory Visit: Payer: Managed Care, Other (non HMO) | Admitting: Nutrition

## 2015-02-19 NOTE — Op Note (Signed)
PATIENT NAME:  [name] [name], [name] [name] MR#:  [id number] DATE OF BIRTH:  1951/07/09  DATE OF PROCEDURE:  12/29/2013  PRINCIPAL DIAGNOSIS: Left ureterolithiasis.   POSTOPERATIVE DIAGNOSIS: Left ureterolithiasis.   PROCEDURE: Left ureteroscopy with holmium laser lithotripsy.    SURGEON: Assunta Gambles, M.D.   ANESTHESIA: Laryngeal mask airway anesthesia.   INDICATIONS: The patient is a 64 year old gentleman with a history of nephrolithiasis. He has been experiencing several months of intermittent left-sided flank pain. A CT scan demonstrated a 6 mm mid left ureteral calculus at the level of the crossing vessels. There has been failure of progression. He has continued to have intermittent pain and discomfort with no significant obstruction. He presents today for ureteroscopic stone removal.   DESCRIPTION OF PROCEDURE: After informed consent was obtained, the patient was taken to the Operating Room and placed in the dorsal lithotomy position under laryngeal mask airway anesthesia. The patient was then prepped and draped in the usual standard fashion. The 22-French rigid cystoscope was introduced into the urethra under direct vision with no urethral abnormalities noted. Upon entering the prostatic fossa, the prostate fossa was noted to be relatively open and relatively short with only minimal hyperplasia. Upon entering the bladder, the mucosa was inspected in its entirety with no gross mucosal lesions noted. Bilateral ureteral orifices were well visualized with no lesions noted. A flexible-tipped Glidewire was introduced into the left ureteral orifice and advanced to the level of the mid ureter. The stone was encountered. Initial attempts were made at passing the guidewire which were unsuccessful. With continued manipulation, the guidewire was able to be successfully advanced into the upper pole collecting system. Once the guidewire was in place, the cystoscope was removed. The 6-French rigid ureteroscope was advanced into  the urinary bladder. It was easily advanced into the left ureteral orifice. A second guidewire was utilized to help navigate the ureteroscope further up the ureter. At the level of the crossing vessels, the stone was encountered. Moderate inflammation was present at the site of stone impaction. The holmium laser fiber was then utilized to fragment the stone into multiple smaller pieces. Initially angulation was difficult to get at the stone with the holmium laser fiber with some manipulation, however, this was successful. As the stone was fragmented, better angle and position was encountered for remainder of stone fragmentation. There was; however, an approximately 4 mm segment that was pushed into a slightly dilated more proximal portion of the ureter. Due to angulation, the laser fiber was not able to be placed on the stone safely. The basket was then utilized to remove the initially-fragmented pieces. The basket was then placed around the larger stone fragment that was slightly more proximal. Gentle traction was placed on the stone due to the edema at the site of previous impaction. The stone was unable to be moved further than this area safely. The basket was disengaged. The ureteroscope was removed. It was replaced alongside of the disengaged basket. The holmium laser fiber was then inserted through the scope. The basket was utilized to help manipulate the stone into proper position. The stone was further fragmented into multiple smaller pieces. The basket was subsequently removed. A subsequent basket was placed through the scope. The remaining fragments were basket extracted. These were collected and will be sent for stone analysis. The scope was advanced to the level of the proximal ureter with no additional stones or other abnormalities appreciated. The site of stone impaction demonstrated only moderate inflammation. The decision was made not to place a  stent. The ureteroscope was removed. The cystoscope was  replaced back into the urinary bladder with the residual stone fragments evacuated. These were placed with the previous-obtained stone fragments. These will all be sent for stone analysis. The bladder was drained. The cystoscope was removed. The patient was returned to the supine position and awakened from laryngeal mask airway anesthesia. He was taken to the recovery room in stable condition. There were no problems or complications. The patient tolerated the procedure well.   ____________________________ [name] FriezeBrian S. Achilles Dunkope, MD bsc:cs D: 12/29/2013 12:38:40 ET T: 12/29/2013 19:20:44 ET JOB#: [id number]  cc: [name] FriezeBrian S. Achilles Dunkope, MD, <Dictator> [name] FriezeBRIAN S Leanza Shepperson MD ELECTRONICALLY SIGNED 01/03/2014 14:25

## 2015-09-06 ENCOUNTER — Other Ambulatory Visit: Payer: Self-pay | Admitting: Family Medicine

## 2015-09-06 DIAGNOSIS — M542 Cervicalgia: Secondary | ICD-10-CM

## 2015-09-09 ENCOUNTER — Ambulatory Visit
Admission: RE | Admit: 2015-09-09 | Discharge: 2015-09-09 | Disposition: A | Discharge status: Home / Self Care | Payer: Medicare Other | Source: Ambulatory Visit | Attending: Family Medicine | Admitting: Family Medicine

## 2015-09-09 ENCOUNTER — Ambulatory Visit: Admission: RE | Admit: 2015-09-09 | Payer: Medicare Other | Source: Ambulatory Visit

## 2015-09-09 ENCOUNTER — Other Ambulatory Visit: Payer: Self-pay | Admitting: Family Medicine

## 2015-09-09 DIAGNOSIS — M542 Cervicalgia: Secondary | ICD-10-CM

## 2016-02-23 ENCOUNTER — Ambulatory Visit: Payer: Medicare Other | Admitting: Podiatry

## 2016-03-23 ENCOUNTER — Encounter: Payer: Self-pay | Admitting: Podiatry

## 2016-03-23 ENCOUNTER — Ambulatory Visit (INDEPENDENT_AMBULATORY_CARE_PROVIDER_SITE_OTHER): Payer: Medicare Other

## 2016-03-23 ENCOUNTER — Ambulatory Visit (INDEPENDENT_AMBULATORY_CARE_PROVIDER_SITE_OTHER): Payer: Medicare Other | Admitting: Podiatry

## 2016-03-23 VITALS — BP 143/87 | HR 87 | Resp 16 | Ht 71.0 in | Wt 218.0 lb

## 2016-03-23 DIAGNOSIS — M79671 Pain in right foot: Secondary | ICD-10-CM | POA: Diagnosis not present

## 2016-03-23 DIAGNOSIS — M2041 Other hammer toe(s) (acquired), right foot: Secondary | ICD-10-CM

## 2016-03-23 DIAGNOSIS — M779 Enthesopathy, unspecified: Secondary | ICD-10-CM

## 2016-03-23 DIAGNOSIS — L84 Corns and callosities: Secondary | ICD-10-CM | POA: Diagnosis not present

## 2016-03-23 MED ORDER — TRIAMCINOLONE ACETONIDE 10 MG/ML IJ SUSP
10.0000 mg | Freq: Once | INTRAMUSCULAR | Status: AC
Start: 2016-03-23 — End: 2016-03-23
  Administered 2016-03-23: 10 mg

## 2016-03-23 NOTE — Progress Notes (Signed)
   Subjective:    Patient ID: [name] [name], male    DOB: 06/19/1951, 65 y.o.   MRN: [id number]  HPI Chief Complaint  Patient presents with  . Callouses    Bilateral; Right foot; plantar forefoot - below 2nd toe; pt stated, "Right foot hurts more"; pt diabetic type 2; sugar=132 this am; A1C=7.6      Review of Systems  All other systems reviewed and are negative.      Objective:   Physical Exam        Assessment & Plan:

## 2016-03-25 NOTE — Progress Notes (Signed)
Subjective:     Patient ID: [name] [name], male   DOB: 01/25/1951, 65 y.o.   MRN: [id number]  HPI patient presents with a lot of pain in the second metatarsophalangeal joint right with inflammation fluid buildup around the joint and keratotic tissue formation that is localized but not as painful. States it's been present for several months   Review of Systems  All other systems reviewed and are negative.      Objective:   Physical Exam  Constitutional: He is oriented to person, place, and time.  Cardiovascular: Intact distal pulses.   Musculoskeletal: Normal range of motion.  Neurological: He is oriented to person, place, and time.  Skin: Skin is warm.  Nursing note and vitals reviewed.  neurovascular status intact muscle strength adequate range of motion within normal limits with fluid buildup and pain around the second metatarsophalangeal joint with inability to walk on this with any degree of comfort. There is mild elevation of the second toe also noted and good digital perfusion and well oriented 3     Assessment:     Inflammatory capsulitis second MPJ right with pain    Plan:     H&P conditions reviewed and recommended procedure for this. Patient carefully had a proximal block done and I explained risk of joint aspiration he wants it done and I aspirated the joint getting out a small amount of clear fluid. I then went ahead and carefully injected with a quarter cc deck some some Kenalog and applied thick plantar pad to take pressure off the joint and reappoint for us to recheck again in the next several weeks  Report indicated that there is slight elevation and medial dislocation second digit

## 2016-03-30 ENCOUNTER — Ambulatory Visit (INDEPENDENT_AMBULATORY_CARE_PROVIDER_SITE_OTHER): Payer: Medicare Other | Admitting: Podiatry

## 2016-03-30 ENCOUNTER — Encounter: Payer: Self-pay | Admitting: Podiatry

## 2016-03-30 VITALS — BP 123/68 | HR 64 | Resp 12

## 2016-03-30 DIAGNOSIS — M779 Enthesopathy, unspecified: Secondary | ICD-10-CM | POA: Diagnosis not present

## 2016-04-02 NOTE — Progress Notes (Signed)
Subjective:     Patient ID: [name] LynnDavid [name], male   DOB: 07/13/1951, 65 y.o.   MRN: [id number]  HPI patient states my right foot is about 50% better but still painful   Review of Systems     Objective:   Physical Exam Neurovascular status intact muscle strength adequate with continued inflammation in the right second metatarsophalangeal joint which has improved but is still present with palpation    Assessment:     Inflammatory capsulitis second MPJ right improved but present    Plan:     Discussed physical therapy anti-inflammatories and scanned for custom orthotics to reduce stress against the joint surface. Reappoint when ready or earlier if needed

## 2016-04-24 ENCOUNTER — Ambulatory Visit: Payer: Medicare Other | Admitting: *Deleted

## 2016-04-24 DIAGNOSIS — M779 Enthesopathy, unspecified: Secondary | ICD-10-CM

## 2016-04-24 NOTE — Progress Notes (Signed)
Patient ID: [name] [name], male   DOB: 03/19/1951, 65 y.o.   MRN: [id number] Patient presents for orthotic pick up.  Verbal and written break in and wear instructions given.  Patient will follow up in 4 weeks if symptoms worsen or fail to improve.

## 2016-04-24 NOTE — Patient Instructions (Signed)

## 2016-05-28 ENCOUNTER — Encounter: Payer: Self-pay | Admitting: Neurology

## 2016-05-28 ENCOUNTER — Ambulatory Visit (INDEPENDENT_AMBULATORY_CARE_PROVIDER_SITE_OTHER): Payer: Medicare Other | Admitting: Neurology

## 2016-05-28 VITALS — BP 135/77 | HR 70 | Ht 71.0 in | Wt 223.8 lb

## 2016-05-28 DIAGNOSIS — R519 Headache, unspecified: Secondary | ICD-10-CM

## 2016-05-28 DIAGNOSIS — R42 Dizziness and giddiness: Secondary | ICD-10-CM

## 2016-05-28 DIAGNOSIS — H539 Unspecified visual disturbance: Secondary | ICD-10-CM | POA: Diagnosis not present

## 2016-05-28 DIAGNOSIS — R110 Nausea: Secondary | ICD-10-CM | POA: Diagnosis not present

## 2016-05-28 DIAGNOSIS — R51 Headache: Secondary | ICD-10-CM

## 2016-05-28 NOTE — Progress Notes (Signed)
[facility]    Provider:  Dr Lucia Gaskins Referring Provider: Dione Housekeeper, MD  Primary Care Physician:  [name] Housekeeper, MD  CC:  headache  HPI:  [name] [name] is a 65 y.o. male here as a referral from Dr. Wallene [name] for pressure in the back of his head since January. His partner [name] [name] is here and provides information as well. Past medical history diabetes, high cholesterol, heart disease, nephrolithiasis Symptoms worsening recently, with new onset headache, nausea, no vomiting. The headache is on the back of the head bilaterally (points to the occipital area bilat). Pressure without shooting, tingling, burning, across the back of the head. He is having pressure every, not specific to time of day or position. Is not associated with neck discomfort or neck pain. 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 On average the pain in the head was uncomfortable not significantly painful, 2 weeks ago the pain worsened with new onset headache. Never had headaches like this. Headache radiates to the top of the head, now feels like his head hurts. He had nausea but no vomiting, the nausea didn't last long and no further nausea. Vision changes in the left eye possibly but correctable with glasses, no double vision. The fingers of his right hand goes to sleep occ , no bowel or bladder issues, no light sensitivity. Headaches have persisted but no nausea or vomiting. No hearing changes. Also endorses word-finding difficulty, speech problems, worsening. Partner says snores loud, worsening, breathing changes but denies morning headaches and no significant daily fatigue to suspect OSA. He is on aspirin 81 mg daily. No family history of headaches, intracranial lesions or malignancies.    Reviewed notes, labs and imaging from outside physicians, which showed: Reviewed notes from primary care.  Patient complaining of recurrent feeling of pressure in the back of his head and of headaches. No previous history of recurrent headaches. Symptoms began months ago Gradually worsened particularly over one weekend. He feels nauseated at times. He had an incident where his partner told him that his words weren't coming out easily. He's had nausea and dizzy with the headaches. Physical activity worsens the symptoms. No associated symptoms such as numbness or tingling, nothing makes it better or worse, denies stiff neck, fevers, chills, trauma to the head.  Personally reviewed imaging of the cervical spine and agree with the following 08/2015: Prior fusion changes at C4-5 with solid bony fusion across the disc space. Degenerative disc disease changes at C3-4, C5-6 and C6-7 with disc space narrowing and spurring. Slight anterolisthesis of C7 on T1 related to facet disease. Prevertebral soft tissues are normal. No fracture. There is mild to moderate bilateral neural foraminal narrowing at C6-7 due to uncovertebral spurring.  Dense carotid bulb calcifications, right greater than left.  IMPRESSION: Prior fusion at C4-5. 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 No acute findings.   Review of Systems: Patient complains of symptoms per HPI as well as the following symptoms: Confusion, headaches, dizziness Pertinent negatives per HPI. All others negative.   Social History   Social History  . Marital status: Single    Spouse name: N/A  . Number of children: 0  . Years of education: Some college   Occupational History  . Retired    Social History Main Topics  . Smoking status: Never Smoker  . Smokeless tobacco: Never Used  . Alcohol  use No  . Drug use: No  . Sexual activity: Not on file   Other Topics Concern  . Not on file   Social History Narrative   Lives with [name] [name], partner.    Caffeine use: none   Drinks diet soda daily     Family History  Problem Relation Age of Onset  . Diabetes Mellitus II Mother   . Heart attack Mother   . Hypertension Mother   . Coronary artery disease Father   . Deep vein thrombosis Maternal Grandmother     Past Medical History:  Diagnosis Date  . Arthritis    some arthritis and djd left knee - s/p partial left knee replacement -now has pain left knee, and knee gives away  . Complication of anesthesia    uvula swelling--day after surgery-could not swallow--required steroids--this was after knee surgery 2010  . Coronary artery spasm (HCC)    diagnosed in the 7o's after chest pains--pt states heart cath negative--was given verapamil to take to prevent further spasms and pt states he has not had any other problems  . Diabetes mellitus   . GERD (gastroesophageal reflux disease)   . Hyperlipidemia   . Pericarditis 2012   tx'd at [facility]--with motrin 800 mg--hospitalized over night--no problems since-    Past Surgical History:  Procedure Laterality Date  . ANTERIOR FUSION CERVICAL SPINE  1980's  . FOREIGN BODY REMOVAL  06/16/2012   Procedure: REMOVAL FOREIGN BODY EXTREMITY;  Surgeon: Shelda Pal, MD;  Location: [facility];  Service: Orthopedics;  Laterality: Left;  . JOINT REPLACEMENT    . left partial knee replacement  2010   surgery was at [facility] in [place]  . nasal septal recontruction  2013  . TOTAL KNEE REVISION  06/16/2012   Procedure: TOTAL KNEE REVISION;  Surgeon: Shelda Pal, MD;  Location: [facility];  Service: Orthopedics;  Laterality: Left;  Revision Left Partial Knee Femoral Component    Current Outpatient Prescriptions  Medication Sig Dispense Refill  . aspirin EC 81 MG tablet Take by mouth.    Marland Kitchen atorvastatin (LIPITOR) 10 MG tablet Take 40 mg by mouth every evening.     Marland Kitchen FARXIGA 10 MG TABS tablet TK 1 T PO QAM  7  . insulin glargine (LANTUS) 100 UNIT/ML injection Inject 38 Units into the skin every morning.    . insulin lispro (HUMALOG KWIKPEN)  100 UNIT/ML KiwkPen     . latanoprost (XALATAN) 0.005 % ophthalmic solution INT 1 GTT IN OU QD HS  1  . LEVEMIR FLEXTOUCH 100 UNIT/ML Pen INJECT 60 UNITS SUBCUTANEOUSLY Q NIGHT  5  . lisinopril (PRINIVIL,ZESTRIL) 30 MG tablet TK 1 T PO  QD  3  . metFORMIN (GLUCOPHAGE) 1000 MG tablet Take 1,000 mg by mouth 2 (two) times daily with a meal.    . pantoprazole (PROTONIX) 40 MG tablet Take 40 mg by mouth every morning.    . pioglitazone (ACTOS) 30 MG tablet TK 1 T PO QD  4  . verapamil (COVERA HS) 240 MG (CO) 24 hr tablet Take 240 mg by mouth every morning.      No current facility-administered medications for this visit.     Allergies as of 05/28/2016 - Review Complete 05/28/2016  Allergen Reaction Noted  . Azithromycin Hives 06/13/2012  . Iodinated diagnostic agents Other (See Comments) 03/23/2016  . Doxycycline calcium Itching and Rash 03/23/2016    Vitals: BP 135/77 (BP Location: Right Arm, Patient Position: Sitting, Cuff  Size: Normal)   Pulse 70   Ht  (1.803 m)   Wt 223 lb 12.8 oz (101.5 kg)   BMI 31.21 kg/m  Last Weight:  Wt Readings from Last 1 Encounters:  05/28/16 223 lb 12.8 oz (101.5 kg)   Last Height:   Ht Readings from Last 1 Encounters:  05/28/16  (1.803 m)   Physical exam: Exam: Gen: NAD, conversant, well nourised, obese, well groomed                     CV: RRR, no MRG. No Carotid Bruits. No peripheral edema, warm, nontender Eyes: Conjunctivae clear without exudates or hemorrhage MSK: No tenderness to palpation of the cervical muscles or muscles of the occipital area.   Neuro: Detailed Neurologic Exam  Speech:    Speech is normal; fluent and spontaneous with normal comprehension.  Cognition:    The patient is oriented to person, place, and time;     recent and remote memory intact;     language fluent;     normal attention, concentration,     fund of knowledge Cranial Nerves:    The pupils are equal, round, and reactive to light. The fundi  are normal and spontaneous venous pulsations are present. Visual fields are full to finger confrontation. Extraocular movements are intact. Trigeminal sensation is intact and the muscles of mastication are normal. The face is symmetric. The palate elevates in the midline. Hearing intact. Voice is normal. Shoulder shrug is normal. The tongue has normal motion without fasciculations.   Coordination:    Normal finger to nose and heel to shin. Normal rapid alternating movements.   Gait:    Heel-toe and tandem gait are normal.   Motor Observation:    No asymmetry, no atrophy, and no involuntary movements noted. Tone:    Normal muscle tone.    Posture:    Posture is normal. normal erect    Strength:    Strength is V/V in the upper and lower limbs.      Sensation: intact to LT     Reflex Exam:  DTR's:    Deep tendon reflexes in the upper and lower extremities are normal bilaterally.   Toes:    The toes are downgoing bilaterally.   Clonus:    Clonus is absent.       Assessment/Plan:  65 year old with new onset headaches after the age of 10, and the occipital area. He does have a history of cervical spine disease and he has multilevel degenerative changes as per imaging performed in 2016 which could be contributing however he reports no neck pain whatsoever and no radiation from her to his neck. Given the symptoms feel as though imaging is warranted, MRI of the brain with and without contrast. We'll order BMP.  Discussed: To prevent or relieve headaches, try the following: Cool Compress. Lie down and place a cool compress on your head.  Avoid headache triggers. If certain foods or odors seem to have triggered your migraines in the past, avoid them. A headache diary might help you identify triggers.  Include physical activity in your daily routine. Try a daily walk or other moderate aerobic exercise.  Manage stress. Find healthy ways to cope with the stressors, such as delegating tasks  on your to-do list.  Practice relaxation techniques. Try deep breathing, yoga, massage and visualization.  Eat regularly. Eating regularly scheduled meals and maintaining a healthy diet might help prevent headaches. Also, drink plenty of fluids.  Follow a regular sleep schedule. Sleep deprivation might contribute to headaches Consider biofeedback. With this mind-body technique, you learn to control certain bodily functions - such as muscle tension, heart rate and blood pressure - to prevent headaches or reduce headache pain.    Proceed to emergency room if you experience new or worsening symptoms or symptoms do not resolve, if you have new neurologic symptoms or if headache is severe, or for any concerning symptom.   CC: [name] Housekeeper, MD   Naomie Dean, MD  [facility] [address] [place], [place] [address]  Phone [phone] Fax [phone]

## 2016-05-28 NOTE — Patient Instructions (Addendum)
Remember to drink plenty of fluid, eat healthy meals and do not skip any meals. Try to eat protein with a every meal and eat a healthy snack such as fruit or nuts in between meals. Try to keep a regular sleep-wake schedule and try to exercise daily, particularly in the form of walking, 20-30 minutes a day, if you can.   As far as your medications are concerned, I would like to suggest: Tylenol as needed, no more than 2-3 days in a week.  As far as diagnostic testing: MRI of the brain, lab  I would like to see you back in 3-4 months if headaches continue, sooner if we need to. Please call us with any interim questions, concerns, problems, updates or refill requests.   Our phone number is [phone]. We also have an after hours call service for urgent matters and there is a physician on-call for urgent questions. For any emergencies you know to call 911 or go to the nearest emergency room

## 2016-05-29 LAB — BASIC METABOLIC PANEL
BUN / CREAT RATIO: 16 (ref 10–24)
BUN: 15 mg/dL (ref 8–27)
CALCIUM: 9.8 mg/dL (ref 8.6–10.2)
CHLORIDE: 101 mmol/L (ref 96–106)
CO2: 25 mmol/L (ref 18–29)
Creatinine, Ser: 0.94 mg/dL (ref 0.76–1.27)
GFR calc non Af Amer: 85 mL/min/{1.73_m2} (ref >59)
GFR, EST AFRICAN AMERICAN: 98 mL/min/{1.73_m2} (ref >59)
Glucose: 105 mg/dL — ABNORMAL HIGH (ref 65–99)
POTASSIUM: 4.8 mmol/L (ref 3.5–5.2)
SODIUM: 142 mmol/L (ref 134–144)

## 2016-05-30 ENCOUNTER — Telehealth: Payer: Self-pay | Admitting: *Deleted

## 2016-05-30 NOTE — Telephone Encounter (Signed)
Called and spoke to pt about lab results per Dr Ahern note. Pt verbalized understanding.  

## 2016-05-30 NOTE — Telephone Encounter (Signed)
-----   Message from Anson Fret, MD sent at 05/29/2016  5:42 PM EDT ----- Labs look fine thanks

## 2016-06-08 ENCOUNTER — Ambulatory Visit
Admission: RE | Admit: 2016-06-08 | Discharge: 2016-06-08 | Disposition: A | Discharge status: Home / Self Care | Payer: Medicare Other | Source: Ambulatory Visit | Attending: Neurology | Admitting: Neurology

## 2016-06-08 DIAGNOSIS — R42 Dizziness and giddiness: Secondary | ICD-10-CM

## 2016-06-08 DIAGNOSIS — R110 Nausea: Secondary | ICD-10-CM

## 2016-06-08 DIAGNOSIS — R519 Headache, unspecified: Secondary | ICD-10-CM

## 2016-06-08 DIAGNOSIS — H539 Unspecified visual disturbance: Secondary | ICD-10-CM

## 2016-06-08 DIAGNOSIS — R51 Headache: Secondary | ICD-10-CM | POA: Diagnosis not present

## 2016-06-08 MED ORDER — GADOBENATE DIMEGLUMINE 529 MG/ML IV SOLN
20.0000 mL | Freq: Once | INTRAVENOUS | Status: AC | PRN
  Administered 2016-06-08: 20 mL via INTRAVENOUS

## 2016-06-11 ENCOUNTER — Telehealth: Payer: Self-pay | Admitting: Neurology

## 2016-06-11 NOTE — Telephone Encounter (Signed)
MRI of the brain did not show any cause for his headaches. There are no masses or strokes in the brain or anything concerning. We did see white matter changes which can be commonly seen due to normal aging but is also due to diabetes, hypertension, cholesterol, obesity and other vascular risk factors. I would advise him to follow closely with primary care to minimize his risk factors, these changes are commonly seen in the brain.thanks

## 2016-06-11 NOTE — Telephone Encounter (Signed)
Dr Lucia GaskinsAhern- pt calling about MRI results.   Called pt back. Advised results not ready yet. Report needs to still be reviewed by Dr Lucia Gaskinsahern. We will call once results are ready. Pt verbalized understanding.

## 2016-06-11 NOTE — Telephone Encounter (Signed)
Called pt back. Went over results per Dr Lucia GaskinsAhern again. He verbalized understanding and wanted copy of MRI results. I advised he can sign up for mychart and see results via mychart as well.

## 2016-06-11 NOTE — Telephone Encounter (Signed)
Patient called to request copy of MRI report also requests to speak to [facility]. Please call [phone].

## 2016-06-11 NOTE — Telephone Encounter (Signed)
Pt request MRI results.  °

## 2016-06-11 NOTE — Telephone Encounter (Signed)
Called and spoke to pt and relayed results per Dr Lucia Gaskinsahern note. Pt verbalized understanding. He has no further questions at this time.

## 2017-03-01 ENCOUNTER — Telehealth: Payer: Self-pay

## 2017-03-01 NOTE — Telephone Encounter (Signed)
SENT NOTES TO SCHEDULING 

## 2017-03-05 ENCOUNTER — Telehealth: Payer: Self-pay | Admitting: Internal Medicine

## 2017-03-05 NOTE — Telephone Encounter (Signed)
[facility] 03/14/17 with Dr Rennis GoldenHilty.  Records put with Dr Blanchie DessertHilty's schedule for 03/14/17. lp

## 2017-03-14 ENCOUNTER — Other Ambulatory Visit: Payer: Self-pay | Admitting: Internal Medicine

## 2017-03-14 ENCOUNTER — Ambulatory Visit (INDEPENDENT_AMBULATORY_CARE_PROVIDER_SITE_OTHER): Payer: Medicare Other | Admitting: Internal Medicine

## 2017-03-14 ENCOUNTER — Encounter: Payer: Self-pay | Admitting: Internal Medicine

## 2017-03-14 VITALS — BP 130/40 | HR 72 | Ht 71.0 in | Wt 231.2 lb

## 2017-03-14 DIAGNOSIS — R079 Chest pain, unspecified: Secondary | ICD-10-CM | POA: Insufficient documentation

## 2017-03-14 DIAGNOSIS — E782 Mixed hyperlipidemia: Secondary | ICD-10-CM | POA: Diagnosis not present

## 2017-03-14 DIAGNOSIS — Z794 Long term (current) use of insulin: Secondary | ICD-10-CM

## 2017-03-14 DIAGNOSIS — R0602 Shortness of breath: Secondary | ICD-10-CM | POA: Diagnosis not present

## 2017-03-14 DIAGNOSIS — E1122 Type 2 diabetes mellitus with diabetic chronic kidney disease: Secondary | ICD-10-CM | POA: Insufficient documentation

## 2017-03-14 DIAGNOSIS — I201 Angina pectoris with documented spasm: Secondary | ICD-10-CM | POA: Insufficient documentation

## 2017-03-14 DIAGNOSIS — E119 Type 2 diabetes mellitus without complications: Secondary | ICD-10-CM | POA: Diagnosis not present

## 2017-03-14 MED ORDER — NITROGLYCERIN 0.4 MG SL SUBL: 0.4000 mg | SUBLINGUAL_TABLET | SUBLINGUAL | 3 refills | Status: DC | PRN

## 2017-03-14 NOTE — Progress Notes (Signed)
OFFICE FOLLOW-UP NOTE  Chief Complaint:  Chest pain, dyspnea  Primary Care Physician: [name] Housekeeper, MD  HPI:  [name] [name] is a 66 y.o. male with a past medial history significant for insulin-dependent diabetes, with onset diabetes in his 19s, GERD, hyperlipidemia, and family history of coronary disease in both parents. [name] [name] has received cardiac care from [facility] cardiology, last being seen in 2015. At the time he was having some chest pain and shortness of breath and underwent a stress echocardiogram. This demonstrated no reversible ischemia and no significant valvular disease. He's had 2 prior car to catheterizations, the last being at [facility] more than 10 years ago. 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 This is been managed with verapamil. Recently he's noted some increase in his chest discomfort. It comes it random times. Not necessarily associated with exertion or relieved by rest. He does not have short acting nitroglycerin to take. He also has noted some worsening dyspnea, particularly when he sings in church in the choir. Recently he is moved to [place] area and is looking to establish with a cardiologist here as well as a primary care provider. With further regards to his chest discomfort, he describes it as a squeezing sensation, lasting more than 5-10 minutes. He denies any stress or anxiety. He is retired. He does not exercise regularly but does some walking. He had knee surgery fairly recently and has had difficulty walking since that time. He's also complaining of some heaviness in his arms. He feels like his hands are "tight". He notices that they swell sometimes when he hangs them down.  PMHx:  Past Medical History:  Diagnosis Date  . Arthritis    some arthritis and djd left knee - s/p partial left knee replacement -now has pain left knee, and knee  gives away  . Complication of anesthesia    uvula swelling--day after surgery-could not swallow--required steroids--this was after knee surgery 2010  . Coronary artery spasm (HCC)    diagnosed in the 7o's after chest pains--pt states heart cath negative--was given verapamil to take to prevent further spasms and pt states he has not had any other problems  . Diabetes mellitus   . GERD (gastroesophageal reflux disease)   . Hyperlipidemia   . Pericarditis 2012   tx'd at [facility]--with motrin 800 mg--hospitalized over night--no problems since-  . Periodic edema     Past Surgical History:  Procedure Laterality Date  . ANTERIOR FUSION CERVICAL SPINE  1980's  . FOREIGN BODY REMOVAL  06/16/2012   Procedure: REMOVAL FOREIGN BODY EXTREMITY;  Surgeon: Shelda Pal, MD;  Location: [facility];  Service: Orthopedics;  Laterality: Left;  . JOINT REPLACEMENT    . left partial knee replacement  2010   surgery was at [facility] in [place], [place]  . nasal septal recontruction  2013  . TOTAL KNEE REVISION  06/16/2012   Procedure: TOTAL KNEE REVISION;  Surgeon: Shelda Pal, MD;  Location: [facility];  Service: Orthopedics;  Laterality: Left;  Revision Left Partial Knee Femoral Component    FAMHx:  Family History  Problem Relation Age of Onset  . Diabetes Mellitus II Mother   . Heart attack Mother   . Hypertension Mother   . Coronary artery disease Father   . Deep vein thrombosis Maternal Grandmother     SOCHx:   reports that he has never smoked. He  has never used smokeless tobacco. He reports that he does not drink alcohol or use drugs.  ALLERGIES:  Allergies  Allergen Reactions  . Iodinated Diagnostic Agents Other (See Comments)  . Azithromycin Hives  . Doxycycline Calcium Itching and Rash  . Vancomycin Itching and Rash    ROS: Pertinent items noted in HPI and remainder of comprehensive ROS otherwise negative.  HOME MEDS: Current Outpatient Prescriptions on File Prior to Visit    Medication Sig Dispense Refill  . aspirin EC 81 MG tablet Take by mouth.    Marland Kitchen. atorvastatin (LIPITOR) 40 MG tablet Take 40 mg by mouth daily.    . insulin lispro (HUMALOG KWIKPEN) 100 UNIT/ML KiwkPen Inject 18 Units into the skin 3 (three) times daily.     Marland Kitchen. latanoprost (XALATAN) 0.005 % ophthalmic solution INT 1 GTT IN OU QD HS  1  . LEVEMIR FLEXTOUCH 100 UNIT/ML Pen INJECT 60 UNITS SUBCUTANEOUSLY Q NIGHT  5  . lisinopril (PRINIVIL,ZESTRIL) 30 MG tablet TK 1 T PO  QD  3  . metFORMIN (GLUCOPHAGE) 1000 MG tablet Take 1,000 mg by mouth 2 (two) times daily with a meal.    . pantoprazole (PROTONIX) 40 MG tablet Take 40 mg by mouth every morning.    . verapamil (COVERA HS) 240 MG (CO) 24 hr tablet Take 240 mg by mouth every morning.      No current facility-administered medications on file prior to visit.     LABS/IMAGING: No results found for this or any previous visit (from the past 48 hour(s)). No results found.  LIPID PANEL: No results found for: CHOL, TRIG, HDL, CHOLHDL, VLDL, LDLCALC, LDLDIRECT   WEIGHTS: Wt Readings from Last 3 Encounters:  03/14/17 231 lb 3.2 oz (104.9 kg)  05/28/16 223 lb 12.8 oz (101.5 kg)  03/23/16 218 lb (98.9 kg)    VITALS: BP (!) 130/40 (BP Location: Right Arm, Patient Position: Sitting, Cuff Size: Normal)   Pulse 72   Ht 5\' 11"  (1.803 m)   Wt 231 lb 3.2 oz (104.9 kg)   BMI 32.25 kg/m   EXAM: General appearance: alert and no distress Neck: no carotid bruit and no JVD Lungs: clear to auscultation bilaterally Heart: regular rate and rhythm, S1, S2 normal, no murmur, click, rub or gallop Abdomen: soft, non-tender; bowel sounds normal; no masses,  no organomegaly Extremities: No upper extremity edema, however delayed capillary refill and venous congestion is noted of both hands, radial pulses are strong on both wrists Pulses: 2+ and symmetric Skin: Skin color, texture, turgor normal. No rashes or lesions Neurologic: Grossly normal Psych:  Pleasant  EKG: Normal sinus rhythm at 72 - personally reviewed  ASSESSMENT: 1. Progressive chest pressure and shortness of breath 2. Prinzmetal's (variant) angina 3. Long-standing insulin dependent diabetes 4. Dyslipidemia 5. Strong family history of coronary disease  PLAN: 1.   Mr. Haywood Lassosom is had recent increase in chest pressure and shortness of breath without any clear provoking symptoms. He has long-standing history of diabetes although is had variant angina in the past without any significant coronary disease by catheterization. That last catheterization was more than 10 years ago at his last stress test was in 2015 which was a stress echocardiogram. I recommend a more sensitive exercise Myoview to look for ischemia. If this is negative we'll treat him as variant or small vessel angina. Most likely consider adding a low-dose nitrate. He does have an interestingly wide pulse pressure 130/40. I do not auscultate a significant diastolic murmur on exam.  We'll have to watch his blood pressures closely. He does not report worsening fatigue or presyncope as may be associated with low diastolic pressure. It's unclear whether he'll tolerate long-acting nitrate. I did provide short acting nitroglycerin for him to use if needed if he has recurrent chest pain which will be helpful diagnostically and therapeutically.  Follow-up after his stress test.  Chrystie Nose, MD, [facility]  [place]  [facility]  Attending Cardiologist  Direct Dial: [phone]  Fax: [phone]  Website:  [email or web address] Nicely [email or web address] 03/14/2017, 10:14 AM

## 2017-03-14 NOTE — Patient Instructions (Addendum)
Medication Instructions:  Use your NTG under your tongue for recurrent chest pain. May take one tablet every 5 minutes. If you are still having discomfort after 3 tablets in 15 minutes, call 911.  Labwork: NONE  Testing/Procedures: Your physician has requested that you have en exercise stress myoview. For further information please visit [email or web address]. Please follow instruction sheet, as given.  Follow-Up: Your physician recommends that you schedule a follow-up appointment in: AFTER YOUR STRESS TEST  Any Other Special Instructions Will Be Listed Below (If Applicable). [facility] [phone]  If you need a refill on your cardiac medications before your next appointment, please call your pharmacy.

## 2017-03-22 ENCOUNTER — Telehealth (HOSPITAL_COMMUNITY): Payer: Self-pay

## 2017-03-22 NOTE — Telephone Encounter (Signed)
Encounter complete. 

## 2017-03-27 ENCOUNTER — Ambulatory Visit (HOSPITAL_COMMUNITY)
Admission: RE | Admit: 2017-03-27 | Discharge: 2017-03-27 | Disposition: A | Discharge status: Home / Self Care | Payer: Medicare Other | Source: Ambulatory Visit | Attending: Internal Medicine | Admitting: Internal Medicine

## 2017-03-27 DIAGNOSIS — R0609 Other forms of dyspnea: Secondary | ICD-10-CM | POA: Diagnosis not present

## 2017-03-27 DIAGNOSIS — E669 Obesity, unspecified: Secondary | ICD-10-CM | POA: Diagnosis not present

## 2017-03-27 DIAGNOSIS — Z8249 Family history of ischemic heart disease and other diseases of the circulatory system: Secondary | ICD-10-CM | POA: Insufficient documentation

## 2017-03-27 DIAGNOSIS — Z8679 Personal history of other diseases of the circulatory system: Secondary | ICD-10-CM | POA: Insufficient documentation

## 2017-03-27 DIAGNOSIS — R079 Chest pain, unspecified: Secondary | ICD-10-CM | POA: Diagnosis present

## 2017-03-27 DIAGNOSIS — R0602 Shortness of breath: Secondary | ICD-10-CM

## 2017-03-27 DIAGNOSIS — Z6832 Body mass index (BMI) 32.0-32.9, adult: Secondary | ICD-10-CM | POA: Diagnosis not present

## 2017-03-27 MED ORDER — TECHNETIUM TC 99M TETROFOSMIN IV KIT
30.4000 | PACK | Freq: Once | INTRAVENOUS | Status: AC | PRN
  Administered 2017-03-27: 30.4 via INTRAVENOUS
  Filled 2017-03-27: qty 31

## 2017-03-27 MED ORDER — TECHNETIUM TC 99M TETROFOSMIN IV KIT
10.6000 | PACK | Freq: Once | INTRAVENOUS | Status: AC | PRN
  Administered 2017-03-27: 10.6 via INTRAVENOUS
  Filled 2017-03-27: qty 11

## 2017-03-28 LAB — MYOCARDIAL PERFUSION IMAGING
CHL CUP NUCLEAR SDS: 6
Estimated workload: 9.9 METS
Exercise duration (min): 9 min
Exercise duration (sec): 0 s
LVDIAVOL: 91 mL (ref 62–150)
LVSYSVOL: 41 mL
MPHR: 154 {beats}/min
NUC STRESS TID: 0.83
Peak HR: 139 {beats}/min
Percent HR: 90 %
RPE: 18
Rest HR: 62 {beats}/min
SRS: 0
SSS: 6

## 2017-04-22 ENCOUNTER — Ambulatory Visit (INDEPENDENT_AMBULATORY_CARE_PROVIDER_SITE_OTHER): Payer: Medicare Other | Admitting: Internal Medicine

## 2017-04-22 ENCOUNTER — Encounter: Payer: Self-pay | Admitting: Internal Medicine

## 2017-04-22 VITALS — BP 134/68 | HR 84 | Ht 71.0 in | Wt 233.6 lb

## 2017-04-22 DIAGNOSIS — R079 Chest pain, unspecified: Secondary | ICD-10-CM

## 2017-04-22 DIAGNOSIS — R600 Localized edema: Secondary | ICD-10-CM | POA: Diagnosis not present

## 2017-04-22 DIAGNOSIS — Z79899 Other long term (current) drug therapy: Secondary | ICD-10-CM | POA: Diagnosis not present

## 2017-04-22 DIAGNOSIS — I201 Angina pectoris with documented spasm: Secondary | ICD-10-CM

## 2017-04-22 DIAGNOSIS — R0602 Shortness of breath: Secondary | ICD-10-CM

## 2017-04-22 MED ORDER — HYDROCHLOROTHIAZIDE 12.5 MG PO CAPS
12.5000 mg | ORAL_CAPSULE | Freq: Every day | ORAL | 1 refills | Status: DC
Start: 2017-04-22 — End: 2018-01-07

## 2017-04-22 NOTE — Patient Instructions (Signed)
Your physician has recommended you make the following change in your medication: START hydrochlorothiazide 12.5mg once daily  Your physician recommends that you return for lab work in: ONE WEEK (BMET)  Your physician wants you to follow-up in: 6 months with Dr. Hilty. You will receive a reminder letter in the mail two months in advance. If you don't receive a letter, please call our office to schedule the follow-up appointment.    

## 2017-04-22 NOTE — Progress Notes (Signed)
OFFICE NOTE  Chief Complaint:  Mild dyspnea, LE swelling  Primary Care Physician: Dione Housekeeper, MD  HPI:  [name] [name] is a 66 y.o. male with a past medial history significant for insulin-dependent diabetes, with onset diabetes in his 85s, GERD, hyperlipidemia, and family history of coronary disease in both parents. [name] [name] has received cardiac care from [facility] cardiology, last being seen in 2015. At the time he was having some chest pain and shortness of breath and underwent a stress echocardiogram. This demonstrated no reversible ischemia and no significant valvular disease. He's had 2 prior car to catheterizations, the last being at [facility] more than 10 years ago. 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 This is been managed with verapamil. Recently he's noted some increase in his chest discomfort. It comes it random times. Not necessarily associated with exertion or relieved by rest. He does not have short acting nitroglycerin to take. He also has noted some worsening dyspnea, particularly when he sings in church in the choir. Recently he is moved to [place] area and is looking to establish with a cardiologist here as well as a primary care provider. With further regards to his chest discomfort, he describes it as a squeezing sensation, lasting more than 5-10 minutes. He denies any stress or anxiety. He is retired. He does not exercise regularly but does some walking. He had knee surgery fairly recently and has had difficulty walking since that time. He's also complaining of some heaviness in his arms. He feels like his hands are "tight". He notices that they swell sometimes when he hangs them down.  04/22/2017  [name] [name] returns today for follow-up. He reports recently having had more shortness of breath, particularly when walking up steep inclines as well as some leg  swelling. He denies any recent angina. He underwent a nuclear stress test which was negative for ischemia and showed normal LV function. At this point is most bothered by his lower extremity swelling. He was noted to have a wide pulse pressure is last office visit which is improved today. I do not auscultate any murmurs. He may have some diastolic dysfunction. We discussed an echocardiogram but will defer that at this time.   PMHx:  Past Medical History:  Diagnosis Date  . Arthritis    some arthritis and djd left knee - s/p partial left knee replacement -now has pain left knee, and knee gives away  . Complication of anesthesia    uvula swelling--day after surgery-could not swallow--required steroids--this was after knee surgery 2010  . Coronary artery spasm (HCC)    diagnosed in the 7o's after chest pains--pt states heart cath negative--was given verapamil to take to prevent further spasms and pt states he has not had any other problems  . Diabetes mellitus   . GERD (gastroesophageal reflux disease)   . Hyperlipidemia   . Pericarditis 2012   tx'd at [facility]--with motrin 800 mg--hospitalized over night--no problems since-  . Periodic edema     Past Surgical History:  Procedure Laterality Date  . ANTERIOR FUSION CERVICAL SPINE  1980's  . FOREIGN BODY REMOVAL  06/16/2012   Procedure: REMOVAL FOREIGN BODY EXTREMITY;  Surgeon: Shelda Pal, MD;  Location: [facility];  Service: Orthopedics;  Laterality: Left;  . JOINT REPLACEMENT    . left partial knee replacement  2010   surgery was at [facility] in [place],  [place]  . nasal septal recontruction  2013  . TOTAL KNEE REVISION  06/16/2012   Procedure: TOTAL KNEE REVISION;  Surgeon: Shelda Pal, MD;  Location: [facility];  Service: Orthopedics;  Laterality: Left;  Revision Left Partial Knee Femoral Component    FAMHx:  Family History  Problem Relation Age of Onset  . Diabetes Mellitus II Mother   . Heart attack Mother   . Hypertension  Mother   . Coronary artery disease Father   . Deep vein thrombosis Maternal Grandmother     SOCHx:   reports that he has never smoked. He has never used smokeless tobacco. He reports that he does not drink alcohol or use drugs.  ALLERGIES:  Allergies  Allergen Reactions  . Iodinated Diagnostic Agents Other (See Comments)  . Azithromycin Hives  . Doxycycline Calcium Itching and Rash  . Vancomycin Itching and Rash    ROS: Pertinent items noted in HPI and remainder of comprehensive ROS otherwise negative.  HOME MEDS: Current Outpatient Prescriptions on File Prior to Visit  Medication Sig Dispense Refill  . aspirin EC 81 MG tablet Take by mouth.    Marland Kitchen atorvastatin (LIPITOR) 40 MG tablet Take 40 mg by mouth daily.    . insulin lispro (HUMALOG KWIKPEN) 100 UNIT/ML KiwkPen Inject 18 Units into the skin 3 (three) times daily.     Marland Kitchen latanoprost (XALATAN) 0.005 % ophthalmic solution INT 1 GTT IN OU QD HS  1  . LEVEMIR FLEXTOUCH 100 UNIT/ML Pen INJECT 60 UNITS SUBCUTANEOUSLY Q NIGHT  5  . lisinopril (PRINIVIL,ZESTRIL) 30 MG tablet TK 1 T PO  QD  3  . metFORMIN (GLUCOPHAGE) 1000 MG tablet Take 1,000 mg by mouth 2 (two) times daily with a meal.    . nitroGLYCERIN (NITROSTAT) 0.4 MG SL tablet PLACE 1 TABLET UNDER THE TONGUE EVERY 5 MINUTES AS NEEDED FOR CHEST PAIN, SEEK MEDICAL ATTENTION IF NO RELIEF AFTER 2ND DOSE 250 tablet 3  . pantoprazole (PROTONIX) 40 MG tablet Take 40 mg by mouth every morning.    . pioglitazone (ACTOS) 45 MG tablet Take 1 tablet by mouth daily.    . verapamil (COVERA HS) 240 MG (CO) 24 hr tablet Take 240 mg by mouth every morning.      No current facility-administered medications on file prior to visit.     LABS/IMAGING: No results found for this or any previous visit (from the past 48 hour(s)). No results found.  LIPID PANEL: No results found for: CHOL, TRIG, HDL, CHOLHDL, VLDL, LDLCALC, LDLDIRECT   WEIGHTS: Wt Readings from Last 3 Encounters:  04/22/17 233  lb 9.6 oz (106 kg)  03/27/17 231 lb (104.8 kg)  03/14/17 231 lb 3.2 oz (104.9 kg)    VITALS: BP 134/68   Pulse 84   Ht 5\' 11"  (1.803 m)   Wt 233 lb 9.6 oz (106 kg)   BMI 32.58 kg/m   EXAM: Deferred  EKG: Deferred  ASSESSMENT: 1. Progressive chest pressure and shortness of breath 2. Prinzmetal's (variant) angina 3. Long-standing insulin dependent diabetes 4. Dyslipidemia 5. Strong family history of coronary disease  PLAN: 1.   [name] [name] reports no worsening chest pressure and is not required nitroglycerin. His shortness of breath is only when walking up inclines. He noted some swelling which worsens throughout the day but is improved when he wakes up in the morning. This could be related to venous insufficiency, calcium channel blocker or long-standing diabetes with possible neuropathy. I recommend starting low-dose diuretic HCTZ  12.5 mg daily. Will check a metabolic profile in a week. Follow-up with me 6 months or sooner as necessary.  Chrystie NoseKenneth C. Simi Briel, MD, [facility]  [place]  [facility]  Attending Cardiologist  Direct Dial: [phone]  Fax: [phone]  Website:  [email or web address]  [name] AbuKenneth C Bubba [name] 04/22/2017, 1:27 PM

## 2017-10-10 ENCOUNTER — Institutional Professional Consult (permissible substitution): Payer: Medicare Other | Admitting: Neurology

## 2017-10-14 ENCOUNTER — Ambulatory Visit (INDEPENDENT_AMBULATORY_CARE_PROVIDER_SITE_OTHER): Payer: Medicare Other | Admitting: Podiatry

## 2017-10-14 ENCOUNTER — Ambulatory Visit (INDEPENDENT_AMBULATORY_CARE_PROVIDER_SITE_OTHER): Payer: Medicare Other

## 2017-10-14 ENCOUNTER — Other Ambulatory Visit: Payer: Self-pay | Admitting: Podiatry

## 2017-10-14 ENCOUNTER — Encounter: Payer: Self-pay | Admitting: Podiatry

## 2017-10-14 DIAGNOSIS — M79672 Pain in left foot: Secondary | ICD-10-CM

## 2017-10-14 DIAGNOSIS — S92912B Unspecified fracture of left toe(s), initial encounter for open fracture: Secondary | ICD-10-CM

## 2017-10-14 DIAGNOSIS — I201 Angina pectoris with documented spasm: Secondary | ICD-10-CM

## 2017-10-14 DIAGNOSIS — B351 Tinea unguium: Secondary | ICD-10-CM

## 2017-10-14 DIAGNOSIS — M79674 Pain in right toe(s): Secondary | ICD-10-CM | POA: Diagnosis not present

## 2017-10-14 DIAGNOSIS — M79675 Pain in left toe(s): Secondary | ICD-10-CM | POA: Diagnosis not present

## 2017-10-15 NOTE — Progress Notes (Signed)
Subjective:   Patient ID: [name] [name], male   DOB: 66 y.o.   MRN: [id number]   HPI Patient presents stating he thinks he dislocated his fifth digit on his left foot fourth toe left foot and he also has nail disease 1-5 both feet that is thick and he cannot take care of himself and states it gets sore   ROS      Objective:  Physical Exam  Neurovascular status intact with patient found to have 2 separate problems with one being thick yellow brittle nailbeds 1-5 both feet and secondarily being probable fracture of the fourth and possible fifth digit of the left foot     Assessment:  Fractured fourth toe left probable along with mycotic nail infection and pain 1-5 both feet     Plan:  H&P conditions reviewed and debridement and nailbeds 1-5 both feet accomplished with no iatrogenic bleeding and advised on the fourth toe that there is a fracture and that he needs to wear wider type shoes utilize ice and this will probably take 8-12 weeks to heal completely reappoint if symptoms persist  X-ray indicates fracture of the proximal phalanx digit 4 left with a appears to be small base fracture fifth digit left

## 2017-12-23 ENCOUNTER — Ambulatory Visit (HOSPITAL_COMMUNITY)
Admission: EM | Admit: 2017-12-23 | Discharge: 2017-12-23 | Disposition: A | Discharge status: Home / Self Care | Payer: Medicare Other | Attending: Family Medicine | Admitting: Family Medicine

## 2017-12-23 ENCOUNTER — Other Ambulatory Visit: Payer: Self-pay | Admitting: Family Medicine

## 2017-12-23 ENCOUNTER — Encounter (HOSPITAL_COMMUNITY): Payer: Self-pay | Admitting: Emergency Medicine

## 2017-12-23 DIAGNOSIS — G44209 Tension-type headache, unspecified, not intractable: Secondary | ICD-10-CM

## 2017-12-23 MED ORDER — KETOROLAC TROMETHAMINE 60 MG/2ML IM SOLN
INTRAMUSCULAR | Status: AC
  Filled 2017-12-23: qty 2

## 2017-12-23 MED ORDER — NAPROXEN 500 MG PO TABS: 500.0000 mg | ORAL_TABLET | Freq: Two times a day (BID) | ORAL | 0 refills | Status: DC

## 2017-12-23 MED ORDER — KETOROLAC TROMETHAMINE 60 MG/2ML IM SOLN
60.0000 mg | Freq: Once | INTRAMUSCULAR | Status: AC
  Administered 2017-12-23: 60 mg via INTRAMUSCULAR

## 2017-12-23 NOTE — Discharge Instructions (Signed)
If this is not helpful, go to the ER if it is truly the worst headache of your life.  Heat (pad or rice pillow in microwave) over affected area, 10-15 minutes twice daily.   OK to take Tylenol 1000 mg (2 extra strength tabs) or 975 mg (3 regular strength tabs) every 6 hours as needed.  EXERCISES RANGE OF MOTION (ROM) AND STRETCHING EXERCISES  These exercises may help you when beginning to rehabilitate your issue. In order to successfully resolve your symptoms, you must improve your posture. These exercises are designed to help reduce the forward-head and rounded-shoulder posture which contributes to this condition. Your symptoms may resolve with or without further involvement from your physician, physical therapist or athletic trainer. While completing these exercises, remember:  Restoring tissue flexibility helps normal motion to return to the joints. This allows healthier, less painful movement and activity. An effective stretch should be held for at least 20 seconds, although you may need to begin with shorter hold times for comfort. A stretch should never be painful. You should only feel a gentle lengthening or release in the stretched tissue. Do not do any stretch or exercise that you cannot tolerate.  STRETCH- Axial Extensors Lie on your back on the floor. You may bend your knees for comfort. Place a rolled-up hand towel or dish towel, about 2 inches in diameter, under the part of your head that makes contact with the floor. Gently tuck your chin, as if trying to make a "double chin," until you feel a gentle stretch at the base of your head. Hold 15-20 seconds. Repeat 2-3 times. Complete this exercise 1 time per day.   STRETCH - Axial Extension  Stand or sit on a firm surface. Assume a good posture: chest up, shoulders drawn back, abdominal muscles slightly tense, knees unlocked (if standing) and feet hip width apart. Slowly retract your chin so your head slides back and your chin  slightly lowers. Continue to look straight ahead. You should feel a gentle stretch in the back of your head. Be certain not to feel an aggressive stretch since this can cause headaches later. Hold for 15-20 seconds. Repeat 2-3 times. Complete this exercise 1 time per day.  STRETCH - Cervical Side Bend  Stand or sit on a firm surface. Assume a good posture: chest up, shoulders drawn back, abdominal muscles slightly tense, knees unlocked (if standing) and feet hip width apart. Without letting your nose or shoulders move, slowly tip your right / left ear to your shoulder until your feel a gentle stretch in the muscles on the opposite side of your neck. Hold 15-20 seconds. Repeat 2-3 times. Complete this exercise 1-2 times per day.  STRETCH - Cervical Rotators  Stand or sit on a firm surface. Assume a good posture: chest up, shoulders drawn back, abdominal muscles slightly tense, knees unlocked (if standing) and feet hip width apart. Keeping your eyes level with the ground, slowly turn your head until you feel a gentle stretch along the back and opposite side of your neck. Hold 15-20 seconds. Repeat 2-3 times. Complete this exercise 1-2 times per day.  RANGE OF MOTION - Neck Circles  Stand or sit on a firm surface. Assume a good posture: chest up, shoulders drawn back, abdominal muscles slightly tense, knees unlocked (if standing) and feet hip width apart. Gently roll your head down and around from the back of one shoulder to the back of the other. The motion should never be forced or painful. Repeat  the motion 10-20 times, or until you feel the neck muscles relax and loosen. Repeat 2-3 times. Complete the exercise 1-2 times per day. STRENGTHENING EXERCISES - Cervical Strain and Sprain These exercises may help you when beginning to rehabilitate your injury. They may resolve your symptoms with or without further involvement from your physician, physical therapist, or athletic trainer. While  completing these exercises, remember:  Muscles can gain both the endurance and the strength needed for everyday activities through controlled exercises. Complete these exercises as instructed by your physician, physical therapist, or athletic trainer. Progress the resistance and repetitions only as guided. You may experience muscle soreness or fatigue, but the pain or discomfort you are trying to eliminate should never worsen during these exercises. If this pain does worsen, stop and make certain you are following the directions exactly. If the pain is still present after adjustments, discontinue the exercise until you can discuss the trouble with your clinician.  STRENGTH - Cervical Flexors, Isometric Face a wall, standing about 6 inches away. Place a small pillow, a ball about 6-8 inches in diameter, or a folded towel between your forehead and the wall. Slightly tuck your chin and gently push your forehead into the soft object. Push only with mild to moderate intensity, building up tension gradually. Keep your jaw and forehead relaxed. Hold 10 to 20 seconds. Keep your breathing relaxed. Release the tension slowly. Relax your neck muscles completely before you start the next repetition. Repeat 2-3 times. Complete this exercise 1 time per day.  STRENGTH- Cervical Lateral Flexors, Isometric  Stand about 6 inches away from a wall. Place a small pillow, a ball about 6-8 inches in diameter, or a folded towel between the side of your head and the wall. Slightly tuck your chin and gently tilt your head into the soft object. Push only with mild to moderate intensity, building up tension gradually. Keep your jaw and forehead relaxed. Hold 10 to 20 seconds. Keep your breathing relaxed. Release the tension slowly. Relax your neck muscles completely before you start the next repetition. Repeat 2-3 times. Complete this exercise 1 time per day.  STRENGTH - Cervical Extensors, Isometric  Stand about 6 inches  away from a wall. Place a small pillow, a ball about 6-8 inches in diameter, or a folded towel between the back of your head and the wall. Slightly tuck your chin and gently tilt your head back into the soft object. Push only with mild to moderate intensity, building up tension gradually. Keep your jaw and forehead relaxed. Hold 10 to 20 seconds. Keep your breathing relaxed. Release the tension slowly. Relax your neck muscles completely before you start the next repetition. Repeat 2-3 times. Complete this exercise 1 time per day.  POSTURE AND BODY MECHANICS CONSIDERATIONS Keeping correct posture when sitting, standing or completing your activities will reduce the stress put on different body tissues, allowing injured tissues a chance to heal and limiting painful experiences. The following are general guidelines for improved posture. Your physician or physical therapist will provide you with any instructions specific to your needs. While reading these guidelines, remember: The exercises prescribed by your provider will help you have the flexibility and strength to maintain correct postures. The correct posture provides the optimal environment for your joints to work. All of your joints have less wear and tear when properly supported by a spine with good posture. This means you will experience a healthier, less painful body. Correct posture must be practiced with all of your  activities, especially prolonged sitting and standing. Correct posture is as important when doing repetitive low-stress activities (typing) as it is when doing a single heavy-load activity (lifting).  PROLONGED STANDING WHILE SLIGHTLY LEANING FORWARD When completing a task that requires you to lean forward while standing in one place for a long time, place either foot up on a stationary 2- to 4-inch high object to help maintain the best posture. When both feet are on the ground, the low back tends to lose its slight inward curve. If  this curve flattens (or becomes too large), then the back and your other joints will experience too much stress, fatigue more quickly, and can cause pain.   RESTING POSITIONS Consider which positions are most painful for you when choosing a resting position. If you have pain with flexion-based activities (sitting, bending, stooping, squatting), choose a position that allows you to rest in a less flexed posture. You would want to avoid curling into a fetal position on your side. If your pain worsens with extension-based activities (prolonged standing, working overhead), avoid resting in an extended position such as sleeping on your stomach. Most people will find more comfort when they rest with their spine in a more neutral position, neither too rounded nor too arched. Lying on a non-sagging bed on your side with a pillow between your knees, or on your back with a pillow under your knees will often provide some relief. Keep in mind, being in any one position for a prolonged period of time, no matter how correct your posture, can still lead to stiffness.  WALKING Walk with an upright posture. Your ears, shoulders, and hips should all line up. OFFICE WORK When working at a desk, create an environment that supports good, upright posture. Without extra support, muscles fatigue and lead to excessive strain on joints and other tissues.  CHAIR: A chair should be able to slide under your desk when your back makes contact with the back of the chair. This allows you to work closely. The chair's height should allow your eyes to be level with the upper part of your monitor and your hands to be slightly lower than your elbows. Body position: Your feet should make contact with the floor. If this is not possible, use a foot rest. Keep your ears over your shoulders. This will reduce stress on your neck and low back.

## 2017-12-23 NOTE — ED Provider Notes (Signed)
  [facility]    CSN: [id number] Arrival date & time: 12/23/17  1907  Chief Complaint  Patient presents with  . Nasal Congestion  . Headache     [name] LynnDavid [name] is a 67 y.o. male here for evaluation of an acute headache.  Duration: 1 day Laterality: bilateral Quality: Aching Severity: 9/10 (calmly speaking with me) Associated symptoms: Neck pain Denies injuries, vision changes, weakness, numbness, tingling, balance issues, new medication, difficulty swallowing, trouble with speech. Therapies tried: Tylenol Hx of migraines: No. He does not normally get headaches.  ROS:  Neuro: +HA MSK: +neck pain  Past Medical History:  Diagnosis Date  . Arthritis    some arthritis and djd left knee - s/p partial left knee replacement -now has pain left knee, and knee gives away  . Complication of anesthesia    uvula swelling--day after surgery-could not swallow--required steroids--this was after knee surgery 2010  . Coronary artery spasm (HCC)    diagnosed in the 7o's after chest pains--pt states heart cath negative--was given verapamil to take to prevent further spasms and pt states he has not had any other problems  . Diabetes mellitus   . GERD (gastroesophageal reflux disease)   . Hyperlipidemia   . Pericarditis 2012   tx'd at [facility]--with motrin 800 mg--hospitalized over night--no problems since-  . Periodic edema    Breast Cancer-relatedfamily history is not on file. Social History   Socioeconomic History  . Marital status: Single  Occupational History  . Occupation: Retired  Tobacco Use  . Smoking status: Never Smoker  . Smokeless tobacco: Never Used  Substance and Sexual Activity  . Alcohol use: No  . Drug use: No  . Sexual activity: Not on file  Other Topics Concern  . Not on file  Social History Narrative   Lives with Rondell ReamsDavid Stinson, partner.    Caffeine use: none   Drinks diet soda daily   Allergies as of 12/23/2017      Reactions   Iodinated Diagnostic  Agents Other (See Comments)   Azithromycin Hives   Doxycycline Calcium Itching, Rash   Vancomycin Itching, Rash    BP (!) 183/79   Pulse 86   Temp 98.6 F (37 C) (Oral)   Resp 16   Wt 234 lb (106.1 kg)   SpO2 96%   BMI 32.64 kg/m  Gen: awake, alert, appearing stated age Eyes: PERRLA, EOMi, no injection Heart: RRR Lungs: CTAB, no accessory muscle use Abd: BS+, soft, NT, ND Neuro: CN2-12 grossly intact, fluent and goal-oriented speech, DTR's equal and symmetric in UE's and LE's MSK: 5/5 strength throughout, no TTP over cervical paraspinal musculature, +TTP over b/l occipital triangle region Psych: Age appropriate judgment and insight, normal affect and mood  Tension headache  Despite pt stating it is the worst headache of his life, I do not think he is having a lifethreatening bleed or ischemia given his normal exam and pain over the occipital triangle region. I do not think he understands the pain scale. He does not do well with steroids given his DM. F/u prn. I did discuss the implications of "the worst headache of one's life" with him. He does prefer to try the anti-inflammatories and go to ER if things worsen.  The pt voiced understanding and agreement to the plan.    Sharlene DoryWendling, Nicholas Paul, [place] 12/23/17 2034

## 2017-12-23 NOTE — ED Triage Notes (Addendum)
PT reports headache, facial pain, congestion that started today.   PT has taken 1000 mg tylenol at 3pm without relief. PT reports worst headache he;s ever had

## 2017-12-24 ENCOUNTER — Emergency Department (HOSPITAL_COMMUNITY): Payer: Medicare Other

## 2017-12-24 ENCOUNTER — Encounter (HOSPITAL_COMMUNITY): Payer: Self-pay

## 2017-12-24 ENCOUNTER — Emergency Department (HOSPITAL_COMMUNITY)
Admission: EM | Admit: 2017-12-24 | Discharge: 2017-12-24 | Disposition: A | Discharge status: Home / Self Care | Payer: Medicare Other | Attending: Physician Assistant | Admitting: Physician Assistant

## 2017-12-24 DIAGNOSIS — Z794 Long term (current) use of insulin: Secondary | ICD-10-CM | POA: Diagnosis not present

## 2017-12-24 DIAGNOSIS — Z7982 Long term (current) use of aspirin: Secondary | ICD-10-CM | POA: Diagnosis not present

## 2017-12-24 DIAGNOSIS — G43811 Other migraine, intractable, with status migrainosus: Secondary | ICD-10-CM

## 2017-12-24 DIAGNOSIS — Z96652 Presence of left artificial knee joint: Secondary | ICD-10-CM | POA: Insufficient documentation

## 2017-12-24 DIAGNOSIS — E119 Type 2 diabetes mellitus without complications: Secondary | ICD-10-CM | POA: Insufficient documentation

## 2017-12-24 DIAGNOSIS — Z79899 Other long term (current) drug therapy: Secondary | ICD-10-CM | POA: Diagnosis not present

## 2017-12-24 DIAGNOSIS — R079 Chest pain, unspecified: Secondary | ICD-10-CM | POA: Diagnosis not present

## 2017-12-24 DIAGNOSIS — R51 Headache: Secondary | ICD-10-CM | POA: Diagnosis present

## 2017-12-24 LAB — BASIC METABOLIC PANEL
Anion gap: 11 (ref 5–15)
BUN: 9 mg/dL (ref 6–20)
CALCIUM: 8.8 mg/dL — AB (ref 8.9–10.3)
CO2: 25 mmol/L (ref 22–32)
CREATININE: 0.92 mg/dL (ref 0.61–1.24)
Chloride: 102 mmol/L (ref 101–111)
GFR calc Af Amer: >60 mL/min (ref >60)
GFR calc non Af Amer: >60 mL/min (ref >60)
Glucose, Bld: 183 mg/dL — ABNORMAL HIGH (ref 65–99)
Potassium: 4.1 mmol/L (ref 3.5–5.1)
SODIUM: 138 mmol/L (ref 135–145)

## 2017-12-24 LAB — INFLUENZA PANEL BY PCR (TYPE A & B)
Influenza A By PCR: NEGATIVE
Influenza B By PCR: NEGATIVE

## 2017-12-24 LAB — CBC
HCT: 40.4 % (ref 39.0–52.0)
Hemoglobin: 13.4 g/dL (ref 13.0–17.0)
MCH: 28.7 pg (ref 26.0–34.0)
MCHC: 33.2 g/dL (ref 30.0–36.0)
MCV: 86.5 fL (ref 78.0–100.0)
PLATELETS: 222 10*3/uL (ref 150–400)
RBC: 4.67 MIL/uL (ref 4.22–5.81)
RDW: 13.4 % (ref 11.5–15.5)
WBC: 9.5 10*3/uL (ref 4.0–10.5)

## 2017-12-24 LAB — I-STAT TROPONIN, ED
TROPONIN I, POC: 0 ng/mL (ref 0.00–0.08)
Troponin i, poc: 0 ng/mL (ref 0.00–0.08)

## 2017-12-24 MED ORDER — SODIUM CHLORIDE 0.9 % IV BOLUS (SEPSIS)
1000.0000 mL | Freq: Once | INTRAVENOUS | Status: AC
  Administered 2017-12-24: 1000 mL via INTRAVENOUS

## 2017-12-24 MED ORDER — ONDANSETRON 4 MG PO TBDP: 4.0000 mg | ORAL_TABLET | Freq: Three times a day (TID) | ORAL | 0 refills | Status: DC | PRN

## 2017-12-24 MED ORDER — IOPAMIDOL (ISOVUE-370) INJECTION 76%
INTRAVENOUS | Status: AC
  Administered 2017-12-24: 50 mL
  Filled 2017-12-24: qty 50

## 2017-12-24 MED ORDER — SODIUM CHLORIDE 0.9 % IV BOLUS (SEPSIS)
500.0000 mL | Freq: Once | INTRAVENOUS | Status: AC
  Administered 2017-12-24: 500 mL via INTRAVENOUS

## 2017-12-24 MED ORDER — PROCHLORPERAZINE EDISYLATE 5 MG/ML IJ SOLN
10.0000 mg | Freq: Once | INTRAMUSCULAR | Status: AC
  Administered 2017-12-24: 10 mg via INTRAVENOUS
  Filled 2017-12-24: qty 2

## 2017-12-24 MED ORDER — DIPHENHYDRAMINE HCL 50 MG/ML IJ SOLN
25.0000 mg | Freq: Once | INTRAMUSCULAR | Status: AC
Start: 2017-12-24 — End: 2017-12-24
  Administered 2017-12-24: 25 mg via INTRAVENOUS
  Filled 2017-12-24: qty 1

## 2017-12-24 MED ORDER — DIPHENHYDRAMINE HCL 50 MG/ML IJ SOLN
25.0000 mg | Freq: Once | INTRAMUSCULAR | Status: AC
  Administered 2017-12-24: 25 mg via INTRAVENOUS
  Filled 2017-12-24: qty 1

## 2017-12-24 MED ORDER — KETOROLAC TROMETHAMINE 30 MG/ML IJ SOLN
30.0000 mg | Freq: Once | INTRAMUSCULAR | Status: AC
  Administered 2017-12-24: 30 mg via INTRAVENOUS
  Filled 2017-12-24: qty 1

## 2017-12-24 MED ORDER — KETOROLAC TROMETHAMINE 30 MG/ML IJ SOLN
15.0000 mg | Freq: Once | INTRAMUSCULAR | Status: AC
  Administered 2017-12-24: 15 mg via INTRAVENOUS
  Filled 2017-12-24: qty 1

## 2017-12-24 MED ORDER — METOCLOPRAMIDE HCL 5 MG/ML IJ SOLN
10.0000 mg | Freq: Once | INTRAMUSCULAR | Status: AC
  Administered 2017-12-24: 10 mg via INTRAVENOUS
  Filled 2017-12-24: qty 2

## 2017-12-24 NOTE — ED Notes (Signed)
Patient is resting with call bell in reach and family at bedside 

## 2017-12-24 NOTE — ED Provider Notes (Addendum)
Patient signed out to me from previous provider.  Patient is a 67 year old male presenting with multiple different complaints.  He complains of cough, headache, body aches.  He had a headache yesterday went to urgent care.  Urgent care gave him steroids  Patient was given migraine cocktail.  Patient was not given a CT angios because of "allergy".  [facility].  Patient felt a little bit better but then had several episodes of vomiting last night came here to the emergency department for further headaches.  Patient has very odd affect initially said he never had a history of headaches before.  However when taking in his charts, patient was seen by a neurologist 2 years ago for severe headaches.   Discussed with patient and patient's partner whether to do a lumbar puncture.  Discussed risks and benefits.  Acknowledged that a CT non-con done outside the 6-hour.  Only has about a 92% sensitivity for subarachnoid hemorrhage.  Lumbar puncture would give us [facility] that are typically more comfortable.  In this discussion again about we discussed patient's allergy to dye.  It turns that patient is not actually allergic to dye at all, he just has a "feeling of whole body warmness".  In light of this patient's partner patient and I decided to go forward with CT Angio of the head to rule out aneurysm as a cause for his acute headache. No cranial nerve deficit.   2nd migraine cocktail given.  Patient has normal chest x-ray, no white count, normal kidney function, normal electrolytes, normal CT, normal CT angios.  Patient still has headache.  Will give second migraine cocktail and then have patient follow-up with neurology as an outpatient.   Abelino DerrickMackuen, Orit Sanville Lyn, MD 12/24/17 1612  8:20 AM Called patient this morning to make sure that he was improving.  He states that he still headache.  He states that now he has a fever.  I recommended that he potentially to come to the emergency  department to get a lumbar puncture given the fever and the continued headache.  Patient states he is a primary care physician appointment today.  I told him I would be in the emergency department for the next 2 days if he would like to come in we can do proceed with a lumbar puncture and repeat his labs.  Ultimately I did not suspect encephalitis or meningitis on his first visit given he had no fever/confusion/meningismus.  However with continued headache and now with new fever, these are added to my differential and would likely do lumbar puncture if I saw him today.    Abelino DerrickMackuen, Matthewjames Petrasek Lyn, MD 12/26/17 [phone]

## 2017-12-24 NOTE — ED Triage Notes (Signed)
Pt today coming from home by ems. Pt today went to urgent for a migraine and was given a steroid for the pain. Pt had relief. Then this morning woke up about an hour ago with severe headache with chest pain. Pt states pain is a 10/10. Pt bp was 185/78 and was down to 175/86. Pt is also c/o of nausea.

## 2017-12-24 NOTE — Discharge Instructions (Signed)
We are unsure what is causing all of your symptoms today.  Both of your CTs, your labs,, x-ray, flu test, vital signs of all been reassuring today.  For your symptoms, we recommend that you take ibuprofen and Tylenol at home.  Be sure to drink plenty of fluids, rest, and follow-up with your neurologist as an outpatient.  Return if you are unable to control your symptoms at home.

## 2017-12-24 NOTE — ED Notes (Signed)
Patient transported to X-ray 

## 2018-01-04 NOTE — ED Provider Notes (Signed)
[facility] EMERGENCY DEPARTMENT Provider Note   CSN: [id number] Arrival date & time: 12/24/17  0533     History   Chief Complaint Chief Complaint  Patient presents with  . Chest Pain  . Migraine    HPI [name] [name] is a 67 y.o. male.  Patient presented to the emergency department for evaluation of headache.  Patient went to urgent care earlier today with complaints of headache.  He was given an injection and had some relief, however later in the day he started to have the headache once again.  Patient reports within the last hour or so he started to have severe 10 out of 10 headache.  He noticed some discomfort in his chest but also has cough, congestion and body aches.  He has not documented any fevers.  He reports that his blood pressure has been elevated.  He has associated nausea and vomiting.  He does not have any unilateral neurologic symptoms.      Past Medical History:  Diagnosis Date  . Arthritis    some arthritis and djd left knee - s/p partial left knee replacement -now has pain left knee, and knee gives away  . Complication of anesthesia    uvula swelling--day after surgery-could not swallow--required steroids--this was after knee surgery 2010  . Coronary artery spasm (HCC)    diagnosed in the 7o's after chest pains--pt states heart cath negative--was given verapamil to take to prevent further spasms and pt states he has not had any other problems  . Diabetes mellitus   . GERD (gastroesophageal reflux disease)   . Hyperlipidemia   . Pericarditis 2012   tx'd at [facility]--with motrin 800 mg--hospitalized over night--no problems since-  . Periodic edema     Patient Active Problem List   Diagnosis Date Noted  . Bilateral leg edema 04/22/2017  . Chest pain 03/14/2017  . SOB (shortness of breath) 03/14/2017  . Prinzmetal variant angina (HCC) 03/14/2017  . Mixed hyperlipidemia 03/14/2017  . Type 2 diabetes mellitus without complication, with  long-term current use of insulin (HCC) 03/14/2017  . S/P left [place] revision 06/16/2012  . S/P left knee arthroscopy 06/16/2012    Past Surgical History:  Procedure Laterality Date  . ANTERIOR FUSION CERVICAL SPINE  1980's  . FOREIGN BODY REMOVAL  06/16/2012   Procedure: REMOVAL FOREIGN BODY EXTREMITY;  Surgeon: Shelda Pal, MD;  Location: [facility];  Service: Orthopedics;  Laterality: Left;  . JOINT REPLACEMENT    . left partial knee replacement  2010   surgery was at [facility] in [place], [place]  . nasal septal recontruction  2013  . TOTAL KNEE REVISION  06/16/2012   Procedure: TOTAL KNEE REVISION;  Surgeon: Shelda Pal, MD;  Location: [facility];  Service: Orthopedics;  Laterality: Left;  Revision Left Partial Knee Femoral Component       Home Medications    Prior to Admission medications   Medication Sig Start Date End Date Taking? Authorizing Provider  acetaminophen (TYLENOL) 325 MG tablet Take 650 mg by mouth every 6 (six) hours as needed for mild pain, fever or headache.   Yes [provider]  aspirin EC 81 MG tablet Take 81 mg by mouth daily.    Yes [provider]  atorvastatin (LIPITOR) 40 MG tablet Take 40 mg by mouth daily.   Yes [provider]  insulin lispro (HUMALOG KWIKPEN) 100 UNIT/ML KiwkPen Inject 18 Units into the skin 3 (three) times daily.  09/20/14  Yes [provider]  latanoprost (XALATAN) 0.005 % ophthalmic solution USE 1 DROP IN BOTH EYES AT BEDTIME 01/24/16  Yes [provider]  LEVEMIR FLEXTOUCH 100 UNIT/ML Pen INJECT 60 UNITS SUBCUTANEOUSLY AT BEDTIME 02/21/16  Yes [provider]  lisinopril (PRINIVIL,ZESTRIL) 30 MG tablet TAKE 30 MG BY MOUTH DAILY 02/18/16  Yes [provider]  metFORMIN (GLUCOPHAGE) 1000 MG tablet Take 1,000 mg by mouth 2 (two) times daily with a meal.   Yes [provider]  nitroGLYCERIN (NITROSTAT) 0.4 MG SL tablet PLACE 1 TABLET UNDER THE TONGUE EVERY 5 MINUTES  AS NEEDED FOR CHEST PAIN, SEEK MEDICAL ATTENTION IF NO RELIEF AFTER 2ND DOSE 03/14/17  Yes Hilty, Lisette Abu, MD  pantoprazole (PROTONIX) 40 MG tablet Take 40 mg by mouth every morning.   Yes [provider]  pioglitazone (ACTOS) 45 MG tablet Take 45 mg by mouth daily.   Yes [provider]  verapamil (COVERA HS) 240 MG (CO) 24 hr tablet Take 240 mg by mouth every morning.    Yes [provider]  hydrochlorothiazide (MICROZIDE) 12.5 MG capsule Take 1 capsule (12.5 mg total) by mouth daily. Patient not taking: Reported on 12/24/2017 04/22/17 12/24/25  Chrystie Nose, MD  naproxen (NAPROSYN) 500 MG tablet TAKE 1 TABLET(500 MG) BY MOUTH TWICE DAILY FOR 7 DAYS 12/25/17   Carmelia Roller, Jilda Roche, DO  ondansetron (ZOFRAN ODT) 4 MG disintegrating tablet Take 1 tablet (4 mg total) by mouth every 8 (eight) hours as needed for nausea or vomiting. 12/24/17   Mackuen, Cindee Salt, MD    Family History Family History  Problem Relation Age of Onset  . Diabetes Mellitus II Mother   . Heart attack Mother   . Hypertension Mother   . Coronary artery disease Father   . Deep vein thrombosis Maternal Grandmother     Social History Social History   Tobacco Use  . Smoking status: Never Smoker  . Smokeless tobacco: Never Used  Substance Use Topics  . Alcohol use: No  . Drug use: No     Allergies   Iodinated diagnostic agents; Azithromycin; Doxycycline calcium; and Vancomycin   Review of Systems Review of Systems  Cardiovascular: Positive for chest pain.  Gastrointestinal: Positive for nausea.  Neurological: Positive for headaches.  All other systems reviewed and are negative.    Physical Exam Updated Vital Signs BP (!) 162/93 (BP Location: Right Arm)   Pulse (!) 103 Comment: Simultaneous filing. User may not have seen previous data.  Temp 99.3 F (37.4 C) (Oral)   Resp (!) 23 Comment: Simultaneous filing. User may not have seen previous data.  Ht 5\' 11"  (1.803 m)    Wt 106.1 kg (234 lb)   SpO2 96% Comment: Simultaneous filing. User may not have seen previous data.  BMI 32.64 kg/m   Physical Exam  Constitutional: He is oriented to person, place, and time. He appears well-developed and well-nourished. No distress.  HENT:  Head: Normocephalic and atraumatic.  Right Ear: Hearing normal.  Left Ear: Hearing normal.  Nose: Nose normal.  Mouth/Throat: Oropharynx is clear and moist and mucous membranes are normal.  Eyes: Conjunctivae and EOM are normal. Pupils are equal, round, and reactive to light.  Neck: Normal range of motion. Neck supple.  Cardiovascular: Regular rhythm, S1 normal and S2 normal. Exam reveals no gallop and no friction rub.  No murmur heard. Pulmonary/Chest: Effort normal and breath sounds normal. No respiratory distress. He exhibits no tenderness.  Abdominal: Soft. Normal appearance  and bowel sounds are normal. There is no hepatosplenomegaly. There is no tenderness. There is no rebound, no guarding, no tenderness at McBurney's point and negative Murphy's sign. No hernia.  Musculoskeletal: Normal range of motion.  Neurological: He is alert and oriented to person, place, and time. He has normal strength. No cranial nerve deficit or sensory deficit. Coordination normal. GCS eye subscore is 4. GCS verbal subscore is 5. GCS motor subscore is 6.  Skin: Skin is warm, dry and intact. No rash noted. No cyanosis.  Psychiatric: He has a normal mood and affect. His speech is normal and behavior is normal. Thought content normal.  Nursing note and vitals reviewed.    ED Treatments / Results  Labs (all labs ordered are listed, but only abnormal results are displayed) Labs Reviewed  BASIC METABOLIC PANEL - Abnormal; Notable for the following components:      Result Value   Glucose, Bld 183 (*)    Calcium 8.8 (*)    All other components within normal limits  CBC  INFLUENZA PANEL BY PCR (TYPE A & B)  I-STAT TROPONIN, ED  I-STAT TROPONIN, ED      EKG  EKG Interpretation  Date/Time:  Tuesday December 24 2017 05:47:58 EST Ventricular Rate:  94 PR Interval:    QRS Duration: 91 QT Interval:  361 QTC Calculation: 452 R Axis:   83 Text Interpretation:  Sinus rhythm Borderline right axis deviation No significant change since last tracing Confirmed by Gilda CreasePollina, Ashawn Rinehart J [phone]) on 12/24/2017 6:22:50 AM Also confirmed by Gilda CreasePollina, Demya Scruggs J [phone]([id number]), editor Elita QuickWatlington, Beverly ([id number])  on 12/24/2017 7:04:29 AM       Radiology No results found.  Procedures Procedures (including critical care time)  Medications Ordered in ED Medications  metoCLOPramide (REGLAN) injection 10 mg (10 mg Intravenous Given 12/24/17 0642)  diphenhydrAMINE (BENADRYL) injection 25 mg (25 mg Intravenous Given 12/24/17 0642)  sodium chloride 0.9 % bolus 500 mL (0 mLs Intravenous Stopped 12/24/17 0853)  diphenhydrAMINE (BENADRYL) injection 25 mg (25 mg Intravenous Given 12/24/17 1109)  sodium chloride 0.9 % bolus 1,000 mL (0 mLs Intravenous Stopped 12/24/17 1109)  ketorolac (TORADOL) 30 MG/ML injection 30 mg (30 mg Intravenous Given 12/24/17 0858)  iopamidol (ISOVUE-370) 76 % injection (50 mLs  Contrast Given 12/24/17 1201)  sodium chloride 0.9 % bolus 500 mL (0 mLs Intravenous Stopped 12/24/17 1624)  ketorolac (TORADOL) 30 MG/ML injection 15 mg (15 mg Intravenous Given 12/24/17 1452)  prochlorperazine (COMPAZINE) injection 10 mg (10 mg Intravenous Given 12/24/17 1456)  diphenhydrAMINE (BENADRYL) injection 25 mg (25 mg Intravenous Given 12/24/17 1456)     Initial Impression / Assessment and Plan / ED Course  I have reviewed the triage vital signs and the nursing notes.  Pertinent labs & imaging results that were available during my care of the patient were reviewed by me and considered in my medical decision making (see chart for details).     Patient presented with multiple symptoms.  He has been experiencing headache at home.  He was seen in urgent  care, given an injection and sent home after his headache was relieved.  He then developed recurrent headache associated with cough, chest congestion, nausea, vomiting.  His neurologic examination was normal.  Blood pressure was somewhat elevated on arrival.  Patient had negative CT head.  He was administered CT angiography and additional workup initiated.  Patient signed out to oncoming ER physician to follow his progression.  Final Clinical Impressions(s) / ED Diagnoses   Final  diagnoses:  Other migraine with status migrainosus, intractable    ED Discharge Orders        Ordered    ondansetron (ZOFRAN ODT) 4 MG disintegrating tablet  Every 8 hours PRN     12/24/17 1619       Gilda Crease, MD 01/04/18 [phone]

## 2018-01-07 ENCOUNTER — Ambulatory Visit (INDEPENDENT_AMBULATORY_CARE_PROVIDER_SITE_OTHER): Payer: Medicare Other | Admitting: Neurology

## 2018-01-07 ENCOUNTER — Telehealth: Payer: Self-pay | Admitting: Neurology

## 2018-01-07 ENCOUNTER — Encounter: Payer: Self-pay | Admitting: Neurology

## 2018-01-07 VITALS — BP 149/89 | HR 74 | Ht 71.0 in | Wt 230.6 lb

## 2018-01-07 DIAGNOSIS — A879 Viral meningitis, unspecified: Secondary | ICD-10-CM | POA: Diagnosis not present

## 2018-01-07 DIAGNOSIS — R519 Headache, unspecified: Secondary | ICD-10-CM

## 2018-01-07 DIAGNOSIS — R51 Headache: Secondary | ICD-10-CM

## 2018-01-07 DIAGNOSIS — R9082 White matter disease, unspecified: Secondary | ICD-10-CM

## 2018-01-07 NOTE — Telephone Encounter (Signed)
01/07/18 Medicare/[employer] fedelity order sent to [facility]

## 2018-01-07 NOTE — Progress Notes (Signed)
[facility]    Provider:  Dr Lucia Gaskins Referring Provider: Dione Housekeeper, * Primary Care Physician:  Dione Housekeeper, MD  CC: new onset  Headache  HPI:  [name] [name] is a 67 y.o. male here as a referral from Dr. Zada Finders for  The headache was bilateral, he reported  He had associated nausea and vomiting.  An episode of headache in the setting of body aches, cough.  Patient has a past medical history of arthritis, DM, HLD, pericarditis. Headache has resolved. He was nauseated, had vomiting, he went by ambulance to the ED, pressure headache, lasted over a week, he had a cough and had sinus infection and was treated with antibiotics . No Hx of migraines. He had no light or sound sensitivity. He still feels pressure on the top of his head. No alteration of awareness. Neck stiffness still there but also improved. Resolved over several days. No other focal neurologic deficits, associated symptoms, inciting events or modifiable factors.   Reviewed notes, labs and imaging from outside physicians, which showed:   Personally reviewed CT head images and agree: : Patchy decreased attenuation throughout the centra semiovale bilaterally. Probable small vessel disease, although a degree of underlying demyelination cannot be excluded. No acute appearing infarct evident. No mass or hemorrhage.   Patient was seen in the emergency room February 25.  He reported headaches, facial pain and congestion that started that day.  The headaches were bilateral, he reported 9 out of 10 (but appeared in no acute distress), associated neck pain, denied other symptoms such as vision changes, weakness, numbness, tingling he had tried Tylenol.  No history of migraines.  Does not normally get headaches.  He was diagnosed with a tension headache, normal exam, he appeared to be exaggerating his pain.He was sent home and was given a steroid for pain.  He was also complaining about cough and body aches.  After  leaving the emergency room he started to have the headache again and he reported it as severe and also cough congestion and body aches.  No fevers.  Blood pressure had been elevated.  Reviewed labs, cbc without elevated wbcs   Review of Systems: Patient complains of symptoms per HPI as well as the following symptoms: headache. Pertinent negatives and positives per HPI. All others negative.   Social History   Socioeconomic History  . Marital status: Married    Spouse name: Not on file  . Number of children: 0  . Years of education: Some college  . Highest education level: Not on file  Social Needs  . Financial resource strain: Not on file  . Food insecurity - worry: Not on file  . Food insecurity - inability: Not on file  . Transportation needs - medical: Not on file  . Transportation needs - non-medical: Not on file  Occupational History  . Occupation: Retired  Tobacco Use  . Smoking status: Never Smoker  . Smokeless tobacco: Never Used  Substance and Sexual Activity  . Alcohol use: No  . Drug use: No  . Sexual activity: Not on file  Other Topics Concern  . Not on file  Social History Narrative   Lives with Criag Wicklund, partner.    Caffeine use: none   Drinks diet sprite, ginger ale daily   Right handed    Family History  Problem Relation Age of Onset  . Diabetes Mellitus II Mother   . Heart attack Mother   . Hypertension Mother   . Coronary artery  disease Father   . Deep vein thrombosis Maternal Grandmother     Past Medical History:  Diagnosis Date  . Arthritis    some arthritis and djd left knee - s/p partial left knee replacement -now has pain left knee, and knee gives away  . Complication of anesthesia    uvula swelling--day after surgery-could not swallow--required steroids--this was after knee surgery 2010  . Coronary artery spasm (HCC)    diagnosed in the 7o's after chest pains--pt states heart cath negative--was given verapamil to take to prevent  further spasms and pt states he has not had any other problems  . Diabetes mellitus   . GERD (gastroesophageal reflux disease)   . Hyperlipidemia   . Pericarditis 2012   tx'd at [facility]--with motrin 800 mg--hospitalized over night--no problems since-  . Periodic edema     Past Surgical History:  Procedure Laterality Date  . ANTERIOR FUSION CERVICAL SPINE  1980's  . FOREIGN BODY REMOVAL  06/16/2012   Procedure: REMOVAL FOREIGN BODY EXTREMITY;  Surgeon: Shelda Pal, MD;  Location: [facility];  Service: Orthopedics;  Laterality: Left;  . JOINT REPLACEMENT    . left partial knee replacement  2010   surgery was at [facility] speciality hospital in , [place]  . nasal septal recontruction  2013  . TOTAL KNEE REVISION  06/16/2012   Procedure: TOTAL KNEE REVISION;  Surgeon: Shelda Pal, MD;  Location: [facility];  Service: Orthopedics;  Laterality: Left;  Revision Left Partial Knee Femoral Component    Current Outpatient Medications  Medication Sig Dispense Refill  . acetaminophen (TYLENOL) 325 MG tablet Take 650 mg by mouth every 6 (six) hours as needed for mild pain, fever or headache.    Marland Kitchen aspirin EC 81 MG tablet Take 81 mg by mouth daily.     Marland Kitchen atorvastatin (LIPITOR) 40 MG tablet Take 40 mg by mouth daily.    . insulin lispro (HUMALOG KWIKPEN) 100 UNIT/ML KiwkPen Inject 18 Units into the skin 3 (three) times daily.     Marland Kitchen latanoprost (XALATAN) 0.005 % ophthalmic solution USE 1 DROP IN BOTH EYES AT BEDTIME  1  . LEVEMIR FLEXTOUCH 100 UNIT/ML Pen INJECT 60 UNITS SUBCUTANEOUSLY AT BEDTIME  5  . lisinopril (PRINIVIL,ZESTRIL) 30 MG tablet TAKE 30 MG BY MOUTH DAILY  3  . metFORMIN (GLUCOPHAGE) 1000 MG tablet Take 1,000 mg by mouth 2 (two) times daily with a meal.    . nitroGLYCERIN (NITROSTAT) 0.4 MG SL tablet PLACE 1 TABLET UNDER THE TONGUE EVERY 5 MINUTES AS NEEDED FOR CHEST PAIN, SEEK MEDICAL ATTENTION IF NO RELIEF AFTER 2ND DOSE 250 tablet 3  . ondansetron (ZOFRAN ODT) 4 MG disintegrating tablet Take 1  tablet (4 mg total) by mouth every 8 (eight) hours as needed for nausea or vomiting. 8 tablet 0  . pantoprazole (PROTONIX) 40 MG tablet Take 40 mg by mouth every morning.    . pioglitazone (ACTOS) 45 MG tablet Take 45 mg by mouth daily.    . verapamil (COVERA HS) 240 MG (CO) 24 hr tablet Take 240 mg by mouth every morning.      No current facility-administered medications for this visit.     Allergies as of 01/07/2018 - Review Complete 01/07/2018  Allergen Reaction Noted  . Azithromycin Hives 06/13/2012  . Doxycycline calcium Itching and Rash 08/12/2014  . Vancomycin Itching and Rash 06/16/2013    Vitals: BP (!) 149/89 (BP Location: Right Arm, Patient Position: Sitting)   Pulse 74  Ht 5\' 11"  (1.803 m)   Wt 230 lb 9.6 oz (104.6 kg)   BMI 32.16 kg/m  Last Weight:  Wt Readings from Last 1 Encounters:  01/07/18 230 lb 9.6 oz (104.6 kg)   Last Height:   Ht Readings from Last 1 Encounters:  01/07/18 5\' 11"  (1.803 m)   Physical exam: Exam: Gen: NAD, conversant, well nourised, obese, well groomed                     CV: RRR, no MRG. No Carotid Bruits. No peripheral edema, warm, nontender Eyes: Conjunctivae clear without exudates or hemorrhage  Neuro: Detailed Neurologic Exam  Speech:    Speech is normal; fluent and spontaneous with normal comprehension.  Cognition:    The patient is oriented to person, place, and time;     recent and remote memory intact;     language fluent;     normal attention, concentration,     fund of knowledge Cranial Nerves:    The pupils are equal, round, and reactive to light. The fundi are normal and spontaneous venous pulsations are present. Visual fields are full to finger confrontation. Extraocular movements are intact. Trigeminal sensation is intact and the muscles of mastication are normal. The face is symmetric. The palate elevates in the midline. Hearing intact. Voice is normal. Shoulder shrug is normal. The tongue has normal motion without  fasciculations.   Coordination:    Normal finger to nose and heel to shin. Normal rapid alternating movements.   Gait:    Heel-toe and tandem gait are normal.   Motor Observation:    No asymmetry, no atrophy, and no involuntary movements noted. Tone:    Normal muscle tone.    Posture:    Posture is normal. normal erect    Strength:    Strength is V/V in the upper and lower limbs.      Sensation: intact to LT     Reflex Exam:  DTR's:    Deep tendon reflexes in the upper and lower extremities are normal bilaterally.   Toes:    The toes are downgoing bilaterally.   Clonus:    Clonus is absent.       Assessment/Plan:  67 year old male with past medical history of hyperlipidemia, diabetes, arthritis here for follow-up from the emergency room after severe headache in the setting of cough, muscle aches, congestion, neck stiffness.  Patient's feeling improved, he still has mild symptoms.  CT of the head and CTA of the head were both negative for etiology.  Patient was treated with antibiotics for sinusitis.  Headache lasted about 10 days, resolving however still mild.  Wonder if this could have been a viral meningitis, continue with conservative measures and follow-up with primary care.  Also discussed vascular risk factors and tight control of those.  Check MRI of the brain with and without contrast for any meningeal inflammation.  Previous MRI in 2017 showed white matter changes which I feel were consistent with age and medical conditions, reviewed with patient and his partner today.  Can follow these white matter changes with a repeat MRI of the brain.   Orders Placed This Encounter  Procedures  . MR BRAIN W WO CONTRAST     Naomie DeanAntonia Gresia Isidoro, MD  [facility] [address] [place], [place] [id number]  Phone [phone] Fax [phone]

## 2018-01-07 NOTE — Patient Instructions (Signed)
Viral Meningitis, Adult °Viral meningitis is an infection of the tissues (meninges) that cover the brain and spinal cord. Many common viruses can cause viral meningitis. °Most people with viral meningitis get better without treatment in about 10 days. However, it is important to be evaluated by your health care provider to make sure you do not have bacterial meningitis. Bacterial meningitis has similar symptoms, but it is much more dangerous and must be treated quickly with antibiotics. °What are the causes? °Many common viruses can cause viral meningitis, including: °· Enteroviruses. These types of viruses are the most common cause of viral meningitis. °· Herpes. °· HIV (human immunodeficiency virus). °· Measles. °· Mumps. °· Chicken pox (varicella-zoster). °· Flu (influenza) viruses. ° °These viruses can be spread in different ways, such as through contact with: °· Stool. This means that you could get sick by touching something that has been contaminated with infected stool and then touching your eyes, nose, or mouth. °· Respiratory secretions. This means that you could get sick from coughs or sneezes of an infected person, similar to the spreading of the common cold. °· Infected blood or infected bodily fluids. °· Rodents. °· Mosquito bites or tick bites. ° °When a virus enters your system, it can spread through the blood to reach the brain and spinal cord. °What increases the risk? °You may be at higher risk for meningitis if you have a weakened disease-fighting system (immune system). °What are the signs or symptoms? °Symptoms of viral meningitis may be similar to symptoms of a cold or flu. Signs and symptoms may include: °· Fever. °· Headache. °· Stiff neck. °· Muscle aches. °· Nausea and vomiting. °· Sensitivity to light. °· Tiredness. °· Cough. ° °How is this diagnosed? °This condition may be diagnosed based on your symptoms, your medical history, and a physical exam. You may be asked to touch your chin to  your neck to see if this causes pain. You may have tests, such as: °· Lumbar puncture. In this procedure, also called a spinal tap, a small amount of fluid from your spinal canal (cerebrospinal fluid) is removed and analyzed. °· Blood tests. °· Other fluid or tissue samples. °· CT scan. °· MRI. ° °How is this treated? °Most types of viral meningitis go away without treatment. You may be given antibiotic medicine through an IV tube until your health care provider is sure that you do not have bacterial meningitis. The antibiotic will be stopped as soon as viral meningitis is diagnosed. °Depending on the type of virus that caused your meningitis, you may be given: °· Antiretroviral medicine. °· Antiviral medicine. °· Medicines that reduce fever and pain. °· Medicines that reduce swelling (steroids). ° °Follow these instructions at home: °· Take over-the-counter and prescription medicines only as told by your health care provider. °· If you are taking a medicine for viral meningitis, do not stop taking the medicine even if you start to feel better. °· Drink enough fluid to keep your urine clear or pale yellow. °· Rest at home until you feel better. Return to your normal activities as told by your health care provider. °· Keep all follow-up visits as told by your health care provider. This is important. °How is this prevented? °· Get a flu shot (influenza vaccination) every year. This will help prevent meningitis that is caused by flu viruses. °· Wash your hands often with soap and water. If soap and water are not available, use hand sanitizer. °· Avoid touching your hands to   your face when you have not washed your hands recently. °· Avoid close contact with people who are sick. °· Disinfect counters and other surfaces if someone in your home is sick. °· Stay home while you are sick, and try to stay away from others as much as possible to avoid spreading the infection. °· Cover your nose and mouth when you sneeze or  cough. °· Use insect repellent to prevent mosquito bites. °Contact a health care provider if: °· Your symptoms do not improve after 7-10 days. °· You have a fever that does not get better with medicine. °Get help right away if: °· Your symptoms get worse. °· You become confused. °· You become very sleepy. °This information is not intended to replace advice given to you by your health care provider. Make sure you discuss any questions you have with your health care provider. °Document Released: 02/06/2016 Document Revised: 03/22/2016 Document Reviewed: 12/19/2015 °Elsevier Interactive Patient Education © 2018 [employer] Inc. ° °

## 2018-01-29 DIAGNOSIS — M6530 Trigger finger, unspecified finger: Secondary | ICD-10-CM | POA: Insufficient documentation

## 2018-02-13 ENCOUNTER — Ambulatory Visit
Admission: RE | Admit: 2018-02-13 | Discharge: 2018-02-13 | Disposition: A | Discharge status: Home / Self Care | Payer: Medicare Other | Source: Ambulatory Visit | Attending: Neurology | Admitting: Neurology

## 2018-02-13 DIAGNOSIS — R519 Headache, unspecified: Secondary | ICD-10-CM

## 2018-02-13 DIAGNOSIS — R51 Headache: Principal | ICD-10-CM

## 2018-02-13 DIAGNOSIS — R9082 White matter disease, unspecified: Secondary | ICD-10-CM

## 2018-02-13 DIAGNOSIS — A879 Viral meningitis, unspecified: Secondary | ICD-10-CM

## 2018-02-13 MED ORDER — GADOBENATE DIMEGLUMINE 529 MG/ML IV SOLN
20.0000 mL | Freq: Once | INTRAVENOUS | Status: AC | PRN
  Administered 2018-02-13: 20 mL via INTRAVENOUS

## 2018-02-18 ENCOUNTER — Telehealth: Payer: Self-pay | Admitting: *Deleted

## 2018-02-18 NOTE — Telephone Encounter (Addendum)
Called pt & LVM (ok per DPR) informing pt that his MRI of his brain showed no stroke, masses, lesions in the brain or anything that would explain or cause his severe headache, no changes since 2017. Left office number in case the patient had any questions but informed him that a return call is not required.   ----- Message from Anson FretAntonia B Ahern, MD sent at 02/17/2018 10:05 PM EDT ----- No strokes, masses, lesions in the brain or ay other pathology to explain or cause his severe headache, no changes since 2017.

## 2018-03-12 ENCOUNTER — Encounter: Payer: Self-pay | Admitting: Podiatry

## 2018-03-12 ENCOUNTER — Ambulatory Visit (INDEPENDENT_AMBULATORY_CARE_PROVIDER_SITE_OTHER): Payer: Medicare Other | Admitting: Podiatry

## 2018-03-12 DIAGNOSIS — B351 Tinea unguium: Secondary | ICD-10-CM

## 2018-03-12 DIAGNOSIS — M79675 Pain in left toe(s): Secondary | ICD-10-CM | POA: Diagnosis not present

## 2018-03-12 DIAGNOSIS — M79674 Pain in right toe(s): Secondary | ICD-10-CM | POA: Diagnosis not present

## 2018-03-12 DIAGNOSIS — L84 Corns and callosities: Secondary | ICD-10-CM

## 2018-03-12 DIAGNOSIS — M79672 Pain in left foot: Secondary | ICD-10-CM

## 2018-03-12 DIAGNOSIS — M79671 Pain in right foot: Secondary | ICD-10-CM

## 2018-03-12 NOTE — Progress Notes (Signed)
Subjective:   Patient ID: [name] [name], male   DOB: 67 y.o.   MRN: [id number]   HPI Patient presents with painful nails 1-5 both feet that he cannot cut   ROS      Objective:  Physical Exam  Neurovascular status intact with patient having foot pain in general but does have nail disease 1-5 both the that are thick yellow brittle with pain     Assessment:  Mycotic nail infection with pain 1-5 both feet     Plan:  Debride painful nailbeds 1-5 both feet with no iatrogenic bleeding noted

## 2020-01-13 ENCOUNTER — Encounter: Payer: Self-pay | Admitting: Internal Medicine

## 2020-06-27 DIAGNOSIS — H18542 Lattice corneal dystrophy, left eye: Secondary | ICD-10-CM | POA: Insufficient documentation

## 2020-06-27 DIAGNOSIS — H524 Presbyopia: Secondary | ICD-10-CM | POA: Insufficient documentation

## 2020-06-27 DIAGNOSIS — H52223 Regular astigmatism, bilateral: Secondary | ICD-10-CM | POA: Insufficient documentation

## 2020-06-27 DIAGNOSIS — Z961 Presence of intraocular lens: Secondary | ICD-10-CM | POA: Insufficient documentation

## 2020-06-27 DIAGNOSIS — H25813 Combined forms of age-related cataract, bilateral: Secondary | ICD-10-CM | POA: Insufficient documentation

## 2020-06-27 DIAGNOSIS — H401134 Primary open-angle glaucoma, bilateral, indeterminate stage: Secondary | ICD-10-CM | POA: Insufficient documentation

## 2022-04-08 DIAGNOSIS — Z9889 Other specified postprocedural states: Secondary | ICD-10-CM | POA: Insufficient documentation

## 2023-04-11 DIAGNOSIS — M65341 Trigger finger, right ring finger: Secondary | ICD-10-CM | POA: Diagnosis not present

## 2023-04-29 ENCOUNTER — Telehealth: Payer: Self-pay | Admitting: Physician Assistant

## 2023-04-30 ENCOUNTER — Encounter: Payer: Self-pay | Admitting: Family Medicine

## 2023-04-30 ENCOUNTER — Ambulatory Visit (INDEPENDENT_AMBULATORY_CARE_PROVIDER_SITE_OTHER): Payer: Medicare Other | Admitting: Family Medicine

## 2023-04-30 VITALS — BP 148/68 | HR 71 | Ht 71.0 in | Wt 238.0 lb

## 2023-04-30 DIAGNOSIS — E1159 Type 2 diabetes mellitus with other circulatory complications: Secondary | ICD-10-CM | POA: Diagnosis not present

## 2023-04-30 DIAGNOSIS — E1122 Type 2 diabetes mellitus with diabetic chronic kidney disease: Secondary | ICD-10-CM | POA: Diagnosis not present

## 2023-04-30 DIAGNOSIS — Z0000 Encounter for general adult medical examination without abnormal findings: Secondary | ICD-10-CM | POA: Diagnosis not present

## 2023-04-30 DIAGNOSIS — E782 Mixed hyperlipidemia: Secondary | ICD-10-CM | POA: Diagnosis not present

## 2023-04-30 DIAGNOSIS — H35412 Lattice degeneration of retina, left eye: Secondary | ICD-10-CM | POA: Diagnosis not present

## 2023-04-30 DIAGNOSIS — Z7689 Persons encountering health services in other specified circumstances: Secondary | ICD-10-CM

## 2023-04-30 DIAGNOSIS — I152 Hypertension secondary to endocrine disorders: Secondary | ICD-10-CM

## 2023-04-30 DIAGNOSIS — N182 Chronic kidney disease, stage 2 (mild): Secondary | ICD-10-CM

## 2023-04-30 DIAGNOSIS — Z794 Long term (current) use of insulin: Secondary | ICD-10-CM | POA: Diagnosis not present

## 2023-04-30 DIAGNOSIS — I201 Angina pectoris with documented spasm: Secondary | ICD-10-CM | POA: Diagnosis not present

## 2023-04-30 DIAGNOSIS — H409 Unspecified glaucoma: Secondary | ICD-10-CM | POA: Insufficient documentation

## 2023-04-30 MED ORDER — ROSUVASTATIN CALCIUM 40 MG PO TABS
40.0000 mg | ORAL_TABLET | Freq: Every day | ORAL | 3 refills | Status: DC
Start: 2023-04-30 — End: 2023-05-28

## 2023-04-30 MED ORDER — FREESTYLE LIBRE 3 SENSOR MISC
1.0000 | 3 refills | Status: DC
Start: 2023-04-30 — End: 2023-07-15

## 2023-04-30 NOTE — Progress Notes (Signed)
New patient visit   Patient: [name] [name]   DOB: 18-Aug-1951   72 y.o. Male  MRN: [id number] Visit Date: 04/30/2023  Today's healthcare provider: Sherlyn Hay, DO   Chief Complaint  Patient presents with   New Patient (Initial Visit)    Patient here to establish care after relocating to [facility]. No other concerns. Patient has completed ROI to send to previous primary care for colonoscopy results and all other pertinent information   Subjective    [name] [name] is a 72 y.o. male who presents today as a new patient to establish care.  HPI HPI     New Patient (Initial Visit)    Additional comments: Patient here to establish care after relocating to [facility]. No other concerns. Patient has completed ROI to send to previous primary care for colonoscopy results and all other pertinent information      Last edited by Acey Lav, CMA on 04/30/2023  8:19 AM.      DM - has had since his early 48s.  - taking humalog and lantus  - BGs - mornings usually low, sometimes under 100.  They go up throughout the day. If he eats the wrong things, it goes up into the 200s. If he eats good things, it'll stay around the 180s.  - Last A1c 8.6. Has gotten as low as 7.2  - Usually takes 24 units at meals (does vary depending on the meal);  selects dose based on current BG before he starts eating.  Blood pressure- Started on lisinopril initially for kidney protection  - Now on either 30 or 40 mg daily; he isn't sure which.  - Thinks might be taking hydrochlorothiazide already (tiny pill, low dosage) but is unsure.   Colonoscopy - done around a year ago Tdap?  Will check Prevnar-20?   Received HepC/HIV screen? Previously screened; both were negative Want PSA level?   No urinary s/sx Chronic care management? No  Covid - Up-to-date Eye exam:  Looking for new eye doctor; doesn't want to go to [facility]  Sees Dr. Adolphus Birchwood dermatology - sees next in September No trouble walking, some  minimal SOB with walking uphill/taking stairs   Past Medical History:  Diagnosis Date   Arthritis    some arthritis and djd left knee - s/p partial left knee replacement -now has pain left knee, and knee gives away   Complication of anesthesia    uvula swelling--day after surgery-could not swallow--required steroids--this was after knee surgery 2010   Coronary artery spasm (HCC)    diagnosed in the 7o's after chest pains--pt states heart cath negative--was given verapamil to take to prevent further spasms and pt states he has not had any other problems   Diabetes mellitus    GERD (gastroesophageal reflux disease)    Glaucoma    Hyperlipidemia    Hypertension associated with diabetes (HCC) 05/03/2023   Pericarditis 2012   tx'd at [facility]--with motrin 800 mg--hospitalized over night--no problems since-   Periodic edema    Past Surgical History:  Procedure Laterality Date   ANTERIOR FUSION CERVICAL SPINE  06/30/1979   CHOLECYSTECTOMY     FOREIGN BODY REMOVAL  06/16/2012   Procedure: REMOVAL FOREIGN BODY EXTREMITY;  Surgeon: Shelda Pal, MD;  Location: [facility];  Service: Orthopedics;  Laterality: Left;   JOINT REPLACEMENT     left partial knee replacement  10/29/2008   surgery was at [facility] in [place], [place]   nasal septal recontruction  10/30/2011   SPINE SURGERY     TOTAL KNEE REVISION  06/16/2012   Procedure: TOTAL KNEE REVISION;  Surgeon: Shelda Pal, MD;  Location: [facility];  Service: Orthopedics;  Laterality: Left;  Revision Left Partial Knee Femoral Component   Family Status  Relation Name Status   Mother ruby Lagos Deceased at age 12   Father Kire Gardner Deceased at age 39   MGM  (Not Specified)   Family History  Problem Relation Age of Onset   Diabetes Mellitus II Mother    Heart attack Mother    Hypertension Mother    Diabetes Mother    Heart disease Mother    Kidney disease Mother    Vision loss Mother    Coronary artery disease Father    Heart  disease Father    Hypertension Father    Kidney disease Father    Deep vein thrombosis Maternal Grandmother    Social History   Socioeconomic History   Marital status: Married    Spouse name: Not on file   Number of children: 0   Years of education: Some college   Highest education level: Some college, no degree  Occupational History   Occupation: Retired  Tobacco Use   Smoking status: Never   Smokeless tobacco: Never  Building services engineer Use: Never used  Substance and Sexual Activity   Alcohol use: No   Drug use: No   Sexual activity: Not on file  Other Topics Concern   Not on file  Social History Narrative   Lives with Robin Babinec, partner.    Caffeine use: none   Drinks diet sprite, ginger ale daily   Right handed   Social Determinants of Health   Financial Resource Strain: Low Risk  (04/29/2023)   Overall Financial Resource Strain (CARDIA)    Difficulty of Paying Living Expenses: Not hard at all  Food Insecurity: No Food Insecurity (04/29/2023)   Hunger Vital Sign    Worried About Running Out of Food in the Last Year: Never true    Ran Out of Food in the Last Year: Never true  Transportation Needs: No Transportation Needs (04/29/2023)   PRAPARE - Administrator, Civil Service (Medical): No    Lack of Transportation (Non-Medical): No  Physical Activity: Insufficiently Active (04/29/2023)   Exercise Vital Sign    Days of Exercise per Week: 3 days    Minutes of Exercise per Session: 30 min  Stress: No Stress Concern Present (04/29/2023)   [employer] of Occupational Health - Occupational Stress Questionnaire    Feeling of Stress : Not at all  Social Connections: Socially Integrated (04/29/2023)   Social Connection and Isolation Panel [NHANES]    Frequency of Communication with Friends and Family: More than three times a week    Frequency of Social Gatherings with Friends and Family: More than three times a week    Attends Religious Services: More than 4  times per year    Active Member of Clubs or Organizations: Yes    Attends Engineer, structural: More than 4 times per year    Marital Status: Married   Outpatient Medications Prior to Visit  Medication Sig   aspirin EC 81 MG tablet Take 81 mg by mouth daily.    insulin glargine (LANTUS) 100 UNIT/ML injection Inject 65 Units into the skin at bedtime.   insulin lispro (HUMALOG KWIKPEN) 100 UNIT/ML KiwkPen Inject 18 Units into the skin 3 (three) times  daily.    latanoprost (XALATAN) 0.005 % ophthalmic solution USE 1 DROP IN BOTH EYES AT BEDTIME   lisinopril (PRINIVIL,ZESTRIL) 30 MG tablet TAKE 30 MG BY MOUTH DAILY   metFORMIN (GLUCOPHAGE) 1000 MG tablet Take 1,000 mg by mouth 2 (two) times daily with a meal.   nitroGLYCERIN (NITROSTAT) 0.4 MG SL tablet PLACE 1 TABLET UNDER THE TONGUE EVERY 5 MINUTES AS NEEDED FOR CHEST PAIN, SEEK MEDICAL ATTENTION IF NO RELIEF AFTER 2ND DOSE   pantoprazole (PROTONIX) 40 MG tablet Take 40 mg by mouth every morning.   pioglitazone (ACTOS) 45 MG tablet Take 45 mg by mouth daily.   verapamil (COVERA HS) 240 MG (CO) 24 hr tablet Take 240 mg by mouth every morning.    [DISCONTINUED] atorvastatin (LIPITOR) 40 MG tablet Take 40 mg by mouth daily.   [DISCONTINUED] acetaminophen (TYLENOL) 325 MG tablet Take 650 mg by mouth every 6 (six) hours as needed for mild pain, fever or headache.   [DISCONTINUED] LEVEMIR FLEXTOUCH 100 UNIT/ML Pen INJECT 60 UNITS SUBCUTANEOUSLY AT BEDTIME   [DISCONTINUED] ondansetron (ZOFRAN ODT) 4 MG disintegrating tablet Take 1 tablet (4 mg total) by mouth every 8 (eight) hours as needed for nausea or vomiting.   No facility-administered medications prior to visit.   Allergies  Allergen Reactions   Iodinated Contrast Media Other (See Comments)    Pt only gets Hot   Other Rash   Azithromycin Hives   Doxycycline Calcium Itching and Rash   Vancomycin Itching and Rash    Immunization History  Administered Date(s) Administered    Covid-19, Mrna,Vaccine(Spikevax)67yrs and older 07/20/2022   Fluad Quad(high Dose 65+) 07/31/2022   Hepatitis B, ADULT 11/10/2007   Influenza Split 07/29/2015   Influenza, High Dose Seasonal PF 07/30/2016, 07/30/2016, 07/29/2017, 07/26/2020   Influenza, Seasonal, Injecte, Preservative Fre 07/10/2013   Influenza-Unspecified 08/03/2011, 08/03/2011, 07/11/2015, 07/29/2015, 07/30/2016, 07/29/2017   Moderna Covid-19 Vaccine Bivalent Booster 1yrs & up 07/11/2021   Moderna Sars-Covid-2 Vaccination 11/18/2019, 12/16/2019, 06/11/2020, 12/05/2020   Pneumococcal Conjugate-13 08/08/2015   Pneumococcal Polysaccharide-23 01/14/2013, 01/14/2013, 07/29/2017, 07/29/2017   Rsv, Bivalent, Protein Subunit Rsvpref,pf Verdis Frederickson) 06/21/2022   Zoster Recombinant(Shingrix) 09/11/2017, 09/30/2017, 12/01/2017   Zoster, Live 10/16/2012    Health Maintenance  Topic Date Due   Hepatitis C Screening  Never done   DTaP/Tdap/Td (1 - Tdap) Never done   Colonoscopy  Never done   INFLUENZA VACCINE  05/30/2023   HEMOGLOBIN A1C  10/31/2023   OPHTHALMOLOGY EXAM  11/01/2023   FOOT EXAM  12/08/2023   Medicare Annual Wellness (AWV)  12/08/2023   Diabetic kidney evaluation - eGFR measurement  04/29/2024   Diabetic kidney evaluation - Urine ACR  04/29/2024   Pneumonia Vaccine 52+ Years old  Completed   COVID-19 Vaccine  Completed   Zoster Vaccines- Shingrix  Completed   HPV VACCINES  Aged Out    Patient Care Team: Shyloh Krinke, Monico Blitz, DO as PCP - General (Family Medicine)  Review of Systems  Eyes:  Negative for visual disturbance.  Respiratory:  Negative for choking, chest tightness and shortness of breath.   Cardiovascular:  Negative for chest pain, palpitations and leg swelling.  Endocrine: Negative for polydipsia, polyphagia and polyuria.       Objective    BP (!) 148/68   Pulse 71   Ht 5\' 11"  (1.803 m)   Wt 238 lb (108 kg)   SpO2 100%   BMI 33.19 kg/m    Physical Exam Vitals reviewed.  Constitutional:  General: He is not in acute distress.    Appearance: Normal appearance. He is well-developed. He is not diaphoretic.  HENT:     Head: Normocephalic and atraumatic.     Right Ear: Tympanic membrane, ear canal and external ear normal.     Left Ear: Tympanic membrane, ear canal and external ear normal.     Nose: Nose normal.     Mouth/Throat:     Mouth: Mucous membranes are moist.     Pharynx: Oropharynx is clear. No oropharyngeal exudate.  Eyes:     General: No scleral icterus.    Conjunctiva/sclera: Conjunctivae normal.     Pupils: Pupils are equal, round, and reactive to light.  Neck:     Thyroid: No thyromegaly.  Cardiovascular:     Rate and Rhythm: Normal rate and regular rhythm.     Pulses: Normal pulses.     Heart sounds: Normal heart sounds. No murmur heard. Pulmonary:     Effort: Pulmonary effort is normal. No respiratory distress.     Breath sounds: Normal breath sounds. No wheezing or rales.  Abdominal:     General: There is no distension.     Palpations: Abdomen is soft.     Tenderness: There is no abdominal tenderness.  Musculoskeletal:        General: No deformity.     Cervical back: Neck supple.     Right lower leg: No edema.     Left lower leg: No edema.  Lymphadenopathy:     Cervical: No cervical adenopathy.  Skin:    General: Skin is warm and dry.     Findings: No rash.  Neurological:     Mental Status: He is alert and oriented to person, place, and time. Mental status is at baseline.     Gait: Gait normal.  Psychiatric:        Mood and Affect: Mood normal.        Behavior: Behavior normal.        Thought Content: Thought content normal.      Depression Screen    04/30/2023    8:16 AM 10/20/2014   11:12 AM  PHQ 2/9 Scores  PHQ - 2 Score 0 0   Results for orders placed or performed in visit on 04/30/23  Comprehensive metabolic panel  Result Value Ref Range   Glucose 67 (L) 70 - 99 mg/dL   BUN 11 8 - 27 mg/dL   Creatinine, Ser 0.86 0.76 -  1.27 mg/dL   eGFR 76 >57 QI/ONG/2.95   BUN/Creatinine Ratio 11 10 - 24   Sodium 143 134 - 144 mmol/L   Potassium 4.3 3.5 - 5.2 mmol/L   Chloride 103 96 - 106 mmol/L   CO2 24 20 - 29 mmol/L   Calcium 9.8 8.6 - 10.2 mg/dL   Total Protein 7.3 6.0 - 8.5 g/dL   Albumin 4.8 3.8 - 4.8 g/dL   Globulin, Total 2.5 1.5 - 4.5 g/dL   Bilirubin Total 0.4 0.0 - 1.2 mg/dL   Alkaline Phosphatase 74 44 - 121 IU/L   AST 25 0 - 40 IU/L   ALT 30 0 - 44 IU/L  Hemoglobin A1c  Result Value Ref Range   Hgb A1c MFr Bld 7.6 (H) 4.8 - 5.6 %   Est. average glucose Bld gHb Est-mCnc 171 mg/dL  Lipid panel  Result Value Ref Range   Cholesterol, Total 163 100 - 199 mg/dL   Triglycerides 284 0 - 149 mg/dL   HDL  59 >39 mg/dL   VLDL Cholesterol Cal 20 5 - 40 mg/dL   LDL Chol Calc (NIH) 84 0 - 99 mg/dL   Chol/HDL Ratio 2.8 0.0 - 5.0 ratio  VITAMIN D 25 Hydroxy (Vit-D Deficiency, Fractures)  Result Value Ref Range   Vit D, 25-Hydroxy 35.3 30.0 - 100.0 ng/mL  Microalbumin / creatinine urine ratio  Result Value Ref Range   Creatinine, Urine 38.9 Not Estab. mg/dL   Microalbumin, Urine 27.2 Not Estab. ug/mL   Microalb/Creat Ratio 167 (H) 0 - 29 mg/g creat    Assessment & Plan     Annual physical exam Assessment & Plan: Overall benign physical exam.  Will check routine blood work as noted.  Orders: -     Comprehensive metabolic panel  Establishing care with new doctor, encounter for  Mixed hyperlipidemia Assessment & Plan: Calculated patient's current ASCVD score based on his previous results that were available, with his current blood pressure and found his score to be 38.9%.  Discussed this with him and discussed switching him from atorvastatin 40 mg to rosuvastatin 40 mg, which will have a significantly increased effect on his cholesterol levels.  Will plan to recheck in 3 months.  Orders: -     Lipid panel -     Rosuvastatin Calcium; Take 1 tablet (40 mg total) by mouth daily.  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  Per discussion with patient, will refer to cardiology to establish care locally in the event his pain starts to recur and/or he starts to develop chest pain of atherosclerotic origin, particularly in the setting of his very high ASCVD risk score.  Orders: -     Ambulatory referral to Cardiology  Type 2 diabetes mellitus with stage 2 chronic kidney disease, with long-term current use of insulin (HCC) Assessment & Plan: Patient reports doing fairly well over the years with managing his type 2 diabetes on Lantus, Humalog, metformin and pioglitazone.   - rechecking his blood work today to reassess.   - also checking a urine microalbumin/creatinine ratio to better characterize kidney dysfunction. - sending prescription for CGM, Freestyle Libre 3, to enable patient to better understand how his eating habits affect his blood sugar and to allow more accurate management of his glucose levels with his insulin.  Orders: -     Hemoglobin A1c -     Lipid panel -     VITAMIN D 25 Hydroxy (Vit-D Deficiency, Fractures) -     Microalbumin / creatinine urine ratio -     FreeStyle Libre 3 Sensor; 1 each by Does not apply route every 14 (fourteen) days. Place 1 sensor on the skin every 14 days. Use to check glucose continuously  Dispense: 2 each; Refill: 3  Hypertension associated with diabetes (HCC) Assessment & Plan: Patient's blood pressure is elevated today.  He notes that he has taken all of his medications and does not yet need refills.   - I discussed options for starting an additional blood pressure medication, hydrochlorothiazide, to which he noted he thinks he might be on that already, noting that he has a small pill he does not remember the name of.  He has not been taking this small pill.   - His lisinopril is also noted to be 30 mg.  He  will be checking to see if this is  accurate, as well as checking the name of the small pill he does not remember.  Sent him a message in MyChart that he can reply directly to, to notify me of what these are. - If his blood pressure is not controlled on full dose lisinopril, full dose hydrochlorothiazide and full dose of verapamil, will need to consider and work-up patient for possible causes of resistant hypertension. - We are also requesting records today.    Retinal lattice degeneration, left Assessment & Plan: Noted.  Patient normally followed by ophthalmologist.  He will be relocating and establishing care with a new one.  He does not notice any difference in his vision; this was only noticed by his ophthalmologist on dilated exam.      Return in about 4 weeks (around 05/28/2023) for HTN.     Total time was 60 minutes. That includes chart review before the visit, the actual patient visit, and time spent on documentation after the visit.  This does not include the time spent on physical exam.  I discussed the assessment and treatment plan with the patient  The patient was provided an opportunity to ask questions and all were answered. The patient agreed with the plan and demonstrated an understanding of the instructions.   The patient was advised to call back or seek an in-person evaluation if the symptoms worsen or if the condition fails to improve as anticipated.   Sherlyn Hay, DO  [facility] [phone] (phone) [phone] (fax)  [facility]

## 2023-04-30 NOTE — Patient Instructions (Addendum)
Recommend check with insurance regarding Prevnar-20 vaccine coverage.  Check small pill we discussed to see if it's hydrochlorothiazide and restart if so.  Please let me know.  Also double check lisinopril dose.  Look into eye doctor in the area.  STOP atorvastatin; START rosuvastatin

## 2023-05-01 ENCOUNTER — Encounter: Payer: Self-pay | Admitting: Family Medicine

## 2023-05-01 LAB — MICROALBUMIN / CREATININE URINE RATIO
Creatinine, Urine: 38.9 mg/dL
Microalb/Creat Ratio: 167 mg/g creat — ABNORMAL HIGH (ref 0–29)
Microalbumin, Urine: 65.1 ug/mL

## 2023-05-01 LAB — LIPID PANEL
Chol/HDL Ratio: 2.8 ratio (ref 0.0–5.0)
Cholesterol, Total: 163 mg/dL (ref 100–199)
HDL: 59 mg/dL (ref >39)
LDL Chol Calc (NIH): 84 mg/dL (ref 0–99)
Triglycerides: 114 mg/dL (ref 0–149)
VLDL Cholesterol Cal: 20 mg/dL (ref 5–40)

## 2023-05-01 LAB — COMPREHENSIVE METABOLIC PANEL
ALT: 30 IU/L (ref 0–44)
AST: 25 IU/L (ref 0–40)
Albumin: 4.8 g/dL (ref 3.8–4.8)
Alkaline Phosphatase: 74 IU/L (ref 44–121)
BUN/Creatinine Ratio: 11 (ref 10–24)
BUN: 11 mg/dL (ref 8–27)
Bilirubin Total: 0.4 mg/dL (ref 0.0–1.2)
CO2: 24 mmol/L (ref 20–29)
Calcium: 9.8 mg/dL (ref 8.6–10.2)
Chloride: 103 mmol/L (ref 96–106)
Creatinine, Ser: 1.04 mg/dL (ref 0.76–1.27)
Globulin, Total: 2.5 g/dL (ref 1.5–4.5)
Glucose: 67 mg/dL — ABNORMAL LOW (ref 70–99)
Potassium: 4.3 mmol/L (ref 3.5–5.2)
Sodium: 143 mmol/L (ref 134–144)
Total Protein: 7.3 g/dL (ref 6.0–8.5)
eGFR: 76 mL/min/{1.73_m2} (ref >59)

## 2023-05-01 LAB — VITAMIN D 25 HYDROXY (VIT D DEFICIENCY, FRACTURES): Vit D, 25-Hydroxy: 35.3 ng/mL (ref 30.0–100.0)

## 2023-05-01 LAB — HEMOGLOBIN A1C
Est. average glucose Bld gHb Est-mCnc: 171 mg/dL
Hgb A1c MFr Bld: 7.6 % — ABNORMAL HIGH (ref 4.8–5.6)

## 2023-05-03 ENCOUNTER — Encounter: Payer: Self-pay | Admitting: Family Medicine

## 2023-05-03 DIAGNOSIS — I152 Hypertension secondary to endocrine disorders: Secondary | ICD-10-CM | POA: Insufficient documentation

## 2023-05-03 DIAGNOSIS — Z0000 Encounter for general adult medical examination without abnormal findings: Secondary | ICD-10-CM | POA: Insufficient documentation

## 2023-05-03 HISTORY — DX: Hypertension secondary to endocrine disorders: I15.2

## 2023-05-03 NOTE — Assessment & Plan Note (Signed)
Overall benign physical exam.  Will check routine blood work as noted.

## 2023-05-03 NOTE — Assessment & Plan Note (Signed)
Calculated patient's current ASCVD score based on his previous results that were available, with his current blood pressure and found his score to be 38.9%.  Discussed this with him and discussed switching him from atorvastatin 40 mg to rosuvastatin 40 mg, which will have a significantly increased effect on his cholesterol levels.  Will plan to recheck in 3 months.

## 2023-05-03 NOTE — Assessment & Plan Note (Signed)
Patient has been stable on verapamil for many years now and has not had recurrence of his angina.  Per discussion with patient, will refer to cardiology to establish care locally in the event his pain starts to recur and/or he starts to develop chest pain of atherosclerotic origin, particularly in the setting of his very high ASCVD risk score.

## 2023-05-03 NOTE — Assessment & Plan Note (Signed)
Patient reports doing fairly well over the years with managing his type 2 diabetes on Lantus, Humalog, metformin and pioglitazone.   - rechecking his blood work today to reassess.   - also checking a urine microalbumin/creatinine ratio to better characterize kidney dysfunction. - sending prescription for CGM, Freestyle Libre 3, to enable patient to better understand how his eating habits affect his blood sugar and to allow more accurate management of his glucose levels with his insulin.

## 2023-05-03 NOTE — Assessment & Plan Note (Signed)
Noted.  Patient normally followed by ophthalmologist.  He will be relocating and establishing care with a new one.  He does not notice any difference in his vision; this was only noticed by his ophthalmologist on dilated exam.

## 2023-05-03 NOTE — Assessment & Plan Note (Signed)
Patient's blood pressure is elevated today.  He notes that he has taken all of his medications and does not yet need refills.   - I discussed options for starting an additional blood pressure medication, hydrochlorothiazide, to which he noted he thinks he might be on that already, noting that he has a small pill he does not remember the name of.  He has not been taking this small pill.   - His lisinopril is also noted to be 30 mg.  He will be checking to see if this is accurate, as well as checking the name of the small pill he does not remember.  Sent him a message in MyChart that he can reply directly to, to notify me of what these are. - If his blood pressure is not controlled on full dose lisinopril, full dose hydrochlorothiazide and full dose of verapamil, will need to consider and work-up patient for possible causes of resistant hypertension. - We are also requesting records today.

## 2023-05-06 ENCOUNTER — Telehealth: Payer: Self-pay

## 2023-05-06 ENCOUNTER — Encounter: Payer: Self-pay | Admitting: Family Medicine

## 2023-05-06 DIAGNOSIS — N182 Chronic kidney disease, stage 2 (mild): Secondary | ICD-10-CM | POA: Insufficient documentation

## 2023-05-06 NOTE — Telephone Encounter (Signed)
Copied from CRM [id number]. Topic: Referral - Request for Referral >> May 06, 2023 10:53 AM Alfred Levins wrote: Pt [name] [name] is requesting a referral to [facility] [phone]

## 2023-05-17 DIAGNOSIS — M25469 Effusion, unspecified knee: Secondary | ICD-10-CM | POA: Insufficient documentation

## 2023-05-17 DIAGNOSIS — H401131 Primary open-angle glaucoma, bilateral, mild stage: Secondary | ICD-10-CM | POA: Diagnosis not present

## 2023-05-17 DIAGNOSIS — M9420 Chondromalacia, unspecified site: Secondary | ICD-10-CM | POA: Insufficient documentation

## 2023-05-17 DIAGNOSIS — M23329 Other meniscus derangements, posterior horn of medial meniscus, unspecified knee: Secondary | ICD-10-CM | POA: Insufficient documentation

## 2023-05-17 DIAGNOSIS — E119 Type 2 diabetes mellitus without complications: Secondary | ICD-10-CM | POA: Diagnosis not present

## 2023-05-17 DIAGNOSIS — M24469 Recurrent dislocation, unspecified knee: Secondary | ICD-10-CM | POA: Insufficient documentation

## 2023-05-17 DIAGNOSIS — R262 Difficulty in walking, not elsewhere classified: Secondary | ICD-10-CM | POA: Insufficient documentation

## 2023-05-17 DIAGNOSIS — M179 Osteoarthritis of knee, unspecified: Secondary | ICD-10-CM | POA: Insufficient documentation

## 2023-05-17 DIAGNOSIS — Z961 Presence of intraocular lens: Secondary | ICD-10-CM | POA: Diagnosis not present

## 2023-05-17 DIAGNOSIS — R269 Unspecified abnormalities of gait and mobility: Secondary | ICD-10-CM | POA: Insufficient documentation

## 2023-05-17 DIAGNOSIS — R609 Edema, unspecified: Secondary | ICD-10-CM | POA: Insufficient documentation

## 2023-05-17 DIAGNOSIS — Z7984 Long term (current) use of oral hypoglycemic drugs: Secondary | ICD-10-CM | POA: Diagnosis not present

## 2023-05-17 DIAGNOSIS — Z794 Long term (current) use of insulin: Secondary | ICD-10-CM | POA: Diagnosis not present

## 2023-05-17 DIAGNOSIS — M1991 Primary osteoarthritis, unspecified site: Secondary | ICD-10-CM | POA: Insufficient documentation

## 2023-05-17 DIAGNOSIS — S8010XA Contusion of unspecified lower leg, initial encounter: Secondary | ICD-10-CM | POA: Insufficient documentation

## 2023-05-17 LAB — HM DIABETES EYE EXAM

## 2023-05-27 ENCOUNTER — Telehealth: Payer: Self-pay | Admitting: Family Medicine

## 2023-05-27 ENCOUNTER — Other Ambulatory Visit: Payer: Self-pay

## 2023-05-27 DIAGNOSIS — E1159 Type 2 diabetes mellitus with other circulatory complications: Secondary | ICD-10-CM

## 2023-05-27 DIAGNOSIS — I201 Angina pectoris with documented spasm: Secondary | ICD-10-CM

## 2023-05-27 MED ORDER — VERAPAMIL HCL ER 240 MG PO TBCR
240.0000 mg | EXTENDED_RELEASE_TABLET | Freq: Every day | ORAL | 1 refills | Status: DC
Start: 2023-05-27 — End: 2023-11-07

## 2023-05-27 NOTE — Telephone Encounter (Signed)
[facility] faxed refill request for the following medications:   verapamil (COVERA HS) 240 MG (CO) 24 hr tablet    Please advise.

## 2023-05-28 ENCOUNTER — Encounter: Payer: Self-pay | Admitting: Family Medicine

## 2023-05-28 ENCOUNTER — Ambulatory Visit (INDEPENDENT_AMBULATORY_CARE_PROVIDER_SITE_OTHER): Payer: Medicare Other | Admitting: Family Medicine

## 2023-05-28 VITALS — BP 140/77 | HR 67 | Temp 97.7°F | Ht 71.0 in | Wt 233.0 lb

## 2023-05-28 DIAGNOSIS — I201 Angina pectoris with documented spasm: Secondary | ICD-10-CM

## 2023-05-28 DIAGNOSIS — I152 Hypertension secondary to endocrine disorders: Secondary | ICD-10-CM | POA: Diagnosis not present

## 2023-05-28 DIAGNOSIS — E1159 Type 2 diabetes mellitus with other circulatory complications: Secondary | ICD-10-CM

## 2023-05-28 DIAGNOSIS — E782 Mixed hyperlipidemia: Secondary | ICD-10-CM

## 2023-05-28 MED ORDER — HYDROCHLOROTHIAZIDE 25 MG PO TABS: 25.0000 mg | ORAL_TABLET | Freq: Every day | ORAL | 3 refills | Status: DC

## 2023-05-28 MED ORDER — ATORVASTATIN CALCIUM 40 MG PO TABS: 40.0000 mg | ORAL_TABLET | Freq: Every day | ORAL | 3 refills | Status: DC

## 2023-05-28 NOTE — Assessment & Plan Note (Signed)
Discussed options to switch back to original dose of atorvastatin (40 mg) versus switching to atorvastatin 80 mg versus switching to original dose for a short period followed by the atorvastatin 80 mg.  Patient opted to restart his original dose and stick with it for now.

## 2023-05-28 NOTE — Progress Notes (Signed)
Established patient visit   Patient: [name] [name]   DOB: Dec 07, 1950   72 y.o. Male  MRN: [id number] Visit Date: 05/28/2023  Today's healthcare provider: Sherlyn Hay, DO   Chief Complaint  Patient presents with   Hypertension    Patient was seen 4 weeks ago.  Addition of medication was discussed at that time if BP readings weren't improving.   Hyperlipidemia    Patient states that on his last visit his Atorvastatin was changed to Rosuvastatin.  Since then he notes having nausea, abdominal pain. muscle aches and weakness.  He would like to go back on the Atorvasatatin.     Subjective    HPI HPI     Hypertension    Additional comments: Patient was seen 4 weeks ago.  Addition of medication was discussed at that time if BP readings weren't improving.        Hyperlipidemia    Additional comments: Patient states that on his last visit his Atorvastatin was changed to Rosuvastatin.  Since then he notes having nausea, abdominal pain. muscle aches and weakness.  He would like to go back on the Atorvasatatin.        Last edited by Adline Peals, CMA on 05/28/2023  1:32 PM.      Hypertension, follow-up  BP Readings from Last 3 Encounters:  05/28/23 (!) 140/77  04/30/23 (!) 148/68  01/07/18 (!) 149/89   Wt Readings from Last 3 Encounters:  05/28/23 233 lb (105.7 kg)  04/30/23 238 lb (108 kg)  01/07/18 230 lb 9.6 oz (104.6 kg)     He was last seen for hypertension 4 weeks ago.  BP at that visit was 148/68. Management since that visit includes restarting his hydrochlorothiazide 12.5 mg daily.  He reports excellent compliance with treatment. He is not having side effects.  He is following a Diabetic diet. He does not smoke.  Use of agents associated with hypertension:  Lisinopril, hydrochlorothiazide and verapamil SR .   Outside blood pressures are being checked and range in the 130s-140s/70s. Symptoms: Yes chest pain No chest pressure  No palpitations No syncope   No dyspnea No orthopnea  No paroxysmal nocturnal dyspnea Yes lower extremity edema  Note: Patient routinely sleeps with 2 pillows for 1 very fluffy/supportive pillow.  He states he is uncomfortable sleeping flat.  Pertinent labs Lab Results  Component Value Date   CHOL 163 04/30/2023   HDL 59 04/30/2023   LDLCALC 84 04/30/2023   TRIG 114 04/30/2023   CHOLHDL 2.8 04/30/2023   Lab Results  Component Value Date   NA 143 04/30/2023   K 4.3 04/30/2023   CREATININE 1.04 04/30/2023   EGFR 76 04/30/2023   GLUCOSE 67 (L) 04/30/2023     The 10-year ASCVD risk score (Arnett DK, et al., 2019) is: 40.6%  ---------------------------------------------------------------------------------------------------  Had some chest pain a few weeks ago, took a nitroglycerin tablet with improvement.  Patient endorses having nausea and muscle pain in his lower extremity since starting the rosuvastatin and would like to resume his original dose of atorvastatin.  Patient did have his eye exam performed and we have received the documentation.   Medications: Outpatient Medications Prior to Visit  Medication Sig Note   aspirin EC 81 MG tablet Take 81 mg by mouth daily.     Continuous Glucose Sensor (FREESTYLE LIBRE 3 SENSOR) MISC 1 each by Does not apply route every 14 (fourteen) days. Place 1 sensor on the skin every 14  days. Use to check glucose continuously    insulin glargine (LANTUS) 100 UNIT/ML injection Inject 65 Units into the skin at bedtime.    insulin lispro (HUMALOG KWIKPEN) 100 UNIT/ML KiwkPen Inject 18 Units into the skin 3 (three) times daily.     latanoprost (XALATAN) 0.005 % ophthalmic solution USE 1 DROP IN BOTH EYES AT BEDTIME    lisinopril (ZESTRIL) 40 MG tablet Take 40 mg by mouth daily.    metFORMIN (GLUCOPHAGE) 1000 MG tablet Take 1,000 mg by mouth 2 (two) times daily with a meal.    nitroGLYCERIN (NITROSTAT) 0.4 MG SL tablet PLACE 1 TABLET UNDER THE TONGUE EVERY 5 MINUTES AS NEEDED  FOR CHEST PAIN, SEEK MEDICAL ATTENTION IF NO RELIEF AFTER 2ND DOSE    pantoprazole (PROTONIX) 40 MG tablet Take 40 mg by mouth every morning.    pioglitazone (ACTOS) 45 MG tablet Take 45 mg by mouth daily.    verapamil (CALAN-SR) 240 MG CR tablet Take 1 tablet (240 mg total) by mouth daily.    [DISCONTINUED] hydrochlorothiazide (HYDRODIURIL) 12.5 MG tablet Take 12.5 mg by mouth daily.    [DISCONTINUED] lisinopril (PRINIVIL,ZESTRIL) 30 MG tablet TAKE 30 MG BY MOUTH DAILY    [DISCONTINUED] rosuvastatin (CRESTOR) 40 MG tablet Take 1 tablet (40 mg total) by mouth daily. 05/28/2023: myalgia, nausea   No facility-administered medications prior to visit.    Review of Systems  Constitutional:  Positive for fatigue. Negative for appetite change, chills and fever.  Eyes:  Negative for visual disturbance.  Respiratory:  Negative for chest tightness, shortness of breath and wheezing.   Cardiovascular:  Positive for chest pain (see HPI) and leg swelling. Negative for palpitations.  Gastrointestinal:  Negative for abdominal pain, nausea and vomiting.  Musculoskeletal:  Positive for myalgias.  Skin:  Negative for rash.  Neurological:  Negative for dizziness and light-headedness.         Objective    BP (!) 140/77 (BP Location: Left Arm, Patient Position: Sitting, Cuff Size: Normal)   Pulse 67   Temp 97.7 F (36.5 C) (Oral)   Ht 5\' 11"  (1.803 m)   Wt 233 lb (105.7 kg)   SpO2 98%   BMI 32.50 kg/m      Physical Exam Vitals reviewed.  Constitutional:      General: He is not in acute distress.    Appearance: Normal appearance. He is not diaphoretic.  HENT:     Head: Normocephalic and atraumatic.  Eyes:     General: No scleral icterus.    Conjunctiva/sclera: Conjunctivae normal.  Cardiovascular:     Rate and Rhythm: Normal rate and regular rhythm.     Pulses: Normal pulses.     Heart sounds: Normal heart sounds. No murmur heard. Pulmonary:     Effort: Pulmonary effort is normal. No  respiratory distress.     Breath sounds: Normal breath sounds. No wheezing or rhonchi.  Musculoskeletal:     Right lower leg: Edema (trace) present.     Left lower leg: Edema (trace) present.  Skin:    General: Skin is warm and dry.     Findings: No rash.  Neurological:     Mental Status: He is alert and oriented to person, place, and time. Mental status is at baseline.  Psychiatric:        Mood and Affect: Mood normal.        Behavior: Behavior normal.      No results found for any visits on 05/28/23.  Assessment &  Plan    Hypertension associated with diabetes (HCC) Assessment & Plan: Patient's blood pressure remains somewhat elevated.  Will go ahead and increase patient's hydrochlorothiazide to 25 mg daily.  Orders: -     hydroCHLOROthiazide; Take 1 tablet (25 mg total) by mouth daily.  Dispense: 90 tablet; Refill: 3  Mixed hyperlipidemia Assessment & Plan: Discussed options to switch back to original dose of atorvastatin (40 mg) versus switching to atorvastatin 80 mg versus switching to original dose for a short period followed by the atorvastatin 80 mg.  Patient opted to restart his original dose and stick with it for now.  Orders: -     Atorvastatin Calcium; Take 1 tablet (40 mg total) by mouth daily.  Dispense: 90 tablet; Refill: 3    Return in about 6 months (around 11/28/2023) for DM.      I discussed the assessment and treatment plan with the patient  The patient was provided an opportunity to ask questions and all were answered. The patient agreed with the plan and demonstrated an understanding of the instructions.   The patient was advised to call back or seek an in-person evaluation if the symptoms worsen or if the condition fails to improve as anticipated.    Sherlyn Hay, DO  [facility] [phone] (phone) [phone] (fax)  [facility]

## 2023-05-28 NOTE — Assessment & Plan Note (Signed)
Patient's blood pressure remains somewhat elevated.  Will go ahead and increase patient's hydrochlorothiazide to 25 mg daily.

## 2023-05-28 NOTE — Assessment & Plan Note (Signed)
Patient is currently scheduled to see cardiology 08/01/2023.  He is also on a wait list for if there is a cancellation.

## 2023-05-29 ENCOUNTER — Encounter: Payer: Self-pay | Admitting: Family Medicine

## 2023-06-18 ENCOUNTER — Ambulatory Visit (INDEPENDENT_AMBULATORY_CARE_PROVIDER_SITE_OTHER): Payer: Medicare Other

## 2023-06-18 VITALS — Ht 71.0 in | Wt 233.0 lb

## 2023-06-18 DIAGNOSIS — Z0000 Encounter for general adult medical examination without abnormal findings: Secondary | ICD-10-CM

## 2023-06-18 NOTE — Progress Notes (Signed)
Subjective:   [name] [name] is a 72 y.o. male who presents for an Initial Medicare Annual Wellness Visit.  Visit Complete: Virtual  I connected with  [name] [name] on 06/18/23 by a audio enabled telemedicine application and verified that I am speaking with the correct person using two identifiers.  Patient Location: Home  Provider Location: Office/Clinic  I discussed the limitations of evaluation and management by telemedicine. The patient expressed understanding and agreed to proceed.  Vital Signs: Unable to obtain new vitals due to this being a telehealth visit.  Patient Medicare AWV questionnaire was completed by the patient on (not done); I have confirmed that all information answered by patient is correct and no changes since this date.  Review of Systems    Cardiac Risk Factors include: advanced age (>5men, >67 women);diabetes mellitus;dyslipidemia;hypertension;male gender;obesity (BMI >30kg/m2)    Objective:    Today's Vitals   06/18/23 0924  Weight: 233 lb (105.7 kg)  Height: 5\' 11"  (1.803 m)   Body mass index is 32.5 kg/m.     06/18/2023    9:34 AM 12/24/2017    5:48 AM 10/20/2014   11:11 AM 06/16/2012    3:43 PM 06/16/2012    7:27 AM 06/11/2012    9:08 AM  Advanced Directives  Does Patient Have a Medical Advance Directive? Yes No No;Yes Patient has advance directive, copy not in chart  Patient has advance directive, copy not in chart  Type of Advance Directive Healthcare Power of [place];Living will   Healthcare Power of [place];Living will  Healthcare Power of [place];Living will  Copy of Healthcare Power of Attorney in Chart?   No - copy requested Copy requested from family  Copy requested from family  Would patient like information on creating a medical advance directive?  No - Patient declined No - patient declined information     Pre-existing out of facility DNR order (yellow form or pink MOST form)    No No     Current Medications (verified) Outpatient  Encounter Medications as of 06/18/2023  Medication Sig   aspirin EC 81 MG tablet Take 81 mg by mouth daily.    atorvastatin (LIPITOR) 40 MG tablet Take 1 tablet (40 mg total) by mouth daily.   Continuous Glucose Sensor (FREESTYLE LIBRE 3 SENSOR) MISC 1 each by Does not apply route every 14 (fourteen) days. Place 1 sensor on the skin every 14 days. Use to check glucose continuously   hydrochlorothiazide (HYDRODIURIL) 25 MG tablet Take 1 tablet (25 mg total) by mouth daily.   insulin glargine (LANTUS) 100 UNIT/ML injection Inject 65 Units into the skin at bedtime.   insulin lispro (HUMALOG KWIKPEN) 100 UNIT/ML KiwkPen Inject 18 Units into the skin 3 (three) times daily.    latanoprost (XALATAN) 0.005 % ophthalmic solution USE 1 DROP IN BOTH EYES AT BEDTIME   lisinopril (ZESTRIL) 40 MG tablet Take 40 mg by mouth daily.   metFORMIN (GLUCOPHAGE) 1000 MG tablet Take 1,000 mg by mouth 2 (two) times daily with a meal.   nitroGLYCERIN (NITROSTAT) 0.4 MG SL tablet PLACE 1 TABLET UNDER THE TONGUE EVERY 5 MINUTES AS NEEDED FOR CHEST PAIN, SEEK MEDICAL ATTENTION IF NO RELIEF AFTER 2ND DOSE   pantoprazole (PROTONIX) 40 MG tablet Take 40 mg by mouth every morning.   pioglitazone (ACTOS) 45 MG tablet Take 45 mg by mouth daily.   verapamil (CALAN-SR) 240 MG CR tablet Take 1 tablet (240 mg total) by mouth daily.   No facility-administered encounter medications on  file as of 06/18/2023.    Allergies (verified) Iodinated contrast media, Other, Azithromycin, Doxycycline calcium, and Vancomycin   History: Past Medical History:  Diagnosis Date   Arthritis    some arthritis and djd left knee - s/p partial left knee replacement -now has pain left knee, and knee gives away   Complication of anesthesia    uvula swelling--day after surgery-could not swallow--required steroids--this was after knee surgery 2010   Coronary artery spasm (HCC)    diagnosed in the 7o's after chest pains--pt states heart cath negative--was  given verapamil to take to prevent further spasms and pt states he has not had any other problems   Diabetes mellitus    GERD (gastroesophageal reflux disease)    Glaucoma    Hyperlipidemia    Hypertension associated with diabetes (HCC) 05/03/2023   Pericarditis 2012   tx'd at [facility]--with motrin 800 mg--hospitalized over night--no problems since-   Periodic edema    Past Surgical History:  Procedure Laterality Date   ANTERIOR FUSION CERVICAL SPINE  06/30/1979   CHOLECYSTECTOMY     FOREIGN BODY REMOVAL  06/16/2012   Procedure: REMOVAL FOREIGN BODY EXTREMITY;  Surgeon: Shelda Pal, MD;  Location: [facility];  Service: Orthopedics;  Laterality: Left;   JOINT REPLACEMENT     left partial knee replacement  10/29/2008   surgery was at [facility] in [place], [place]   nasal septal recontruction  10/30/2011   SPINE SURGERY     TOTAL KNEE REVISION  06/16/2012   Procedure: TOTAL KNEE REVISION;  Surgeon: Shelda Pal, MD;  Location: [facility];  Service: Orthopedics;  Laterality: Left;  Revision Left Partial Knee Femoral Component   Family History  Problem Relation Age of Onset   Diabetes Mellitus II Mother    Heart attack Mother    Hypertension Mother    Diabetes Mother    Heart disease Mother    Kidney disease Mother    Vision loss Mother    Coronary artery disease Father    Heart disease Father    Hypertension Father    Kidney disease Father    Deep vein thrombosis Maternal Grandmother    Social History   Socioeconomic History   Marital status: Married    Spouse name: Not on file   Number of children: 0   Years of education: Some college   Highest education level: Some college, no degree  Occupational History   Occupation: Retired  Tobacco Use   Smoking status: Never   Smokeless tobacco: Never  Vaping Use   Vaping status: Never Used  Substance and Sexual Activity   Alcohol use: No   Drug use: No   Sexual activity: Not on file  Other Topics Concern   Not on file   Social History Narrative   Lives with [name] [name], partner.    Caffeine use: none   Drinks diet sprite, ginger ale daily   Right handed   Social Determinants of Health   Financial Resource Strain: Low Risk  (06/18/2023)   Overall Financial Resource Strain (CARDIA)    Difficulty of Paying Living Expenses: Not hard at all  Food Insecurity: No Food Insecurity (06/18/2023)   Hunger Vital Sign    Worried About Running Out of Food in the Last Year: Never true    Ran Out of Food in the Last Year: Never true  Transportation Needs: No Transportation Needs (06/18/2023)   PRAPARE - Administrator, Civil Service (Medical): No  Lack of Transportation (Non-Medical): No  Physical Activity: Insufficiently Active (06/18/2023)   Exercise Vital Sign    Days of Exercise per Week: 4 days    Minutes of Exercise per Session: 30 min  Stress: No Stress Concern Present (06/18/2023)   [employer] of Occupational Health - Occupational Stress Questionnaire    Feeling of Stress : Not at all  Social Connections: Socially Integrated (06/18/2023)   Social Connection and Isolation Panel [NHANES]    Frequency of Communication with Friends and Family: More than three times a week    Frequency of Social Gatherings with Friends and Family: More than three times a week    Attends Religious Services: More than 4 times per year    Active Member of [employer] or Organizations: Yes    Attends Engineer, structural: More than 4 times per year    Marital Status: Married    Tobacco Counseling Counseling given: Not Answered   Clinical Intake:  Pre-visit preparation completed: Yes  Pain : No/denies pain    BMI - recorded: 32.5 Nutritional Status: BMI > 30  Obese Nutritional Risks: None Diabetes: Yes CBG done?: Yes (BS 89 this am at home) CBG resulted in Enter/ Edit results?: No Did pt. bring in CBG monitor from home?: No  How often do you need to have someone help you when you read  instructions, pamphlets, or other written materials from your doctor or pharmacy?: 1 - Never  Interpreter Needed?: No  Comments: lives with husband Information entered by :: B.Yida Hyams,LPN   Activities of Daily Living    06/18/2023    9:34 AM  In your present state of health, do you have any difficulty performing the following activities:  Hearing? 0  Vision? 1  Difficulty concentrating or making decisions? 0  Walking or climbing stairs? 0  Dressing or bathing? 0  Doing errands, shopping? 0  Preparing Food and eating ? N  Using the Toilet? N  In the past six months, have you accidently leaked urine? N  Do you have problems with loss of bowel control? N  Managing your Medications? N  Managing your Finances? N  Housekeeping or managing your Housekeeping? N    Patient Care Team: Sherlyn Hay, DO as PCP - General (Family Medicine)  Indicate any recent Medical Services you may have received from other than [facility] providers in the past year (date may be approximate).     Assessment:   This is a routine wellness examination for [facility].  Hearing/Vision screen Hearing Screening - Comments:: Adequate hearing Vision Screening - Comments:: Adequate vision [facility] [place]  Dietary issues and exercise activities discussed:     Goals Addressed             This Visit's Progress    Weight (lb) < 200 lb (90.7 kg)   233 lb (105.7 kg)    Pt wants to lose 20lb.Pt will walk a little more and increase his water consumption.       Depression Screen    06/18/2023    9:30 AM 04/30/2023    8:16 AM 10/20/2014   11:12 AM  PHQ 2/9 Scores  PHQ - 2 Score 0 0 0    Fall Risk    06/18/2023    9:27 AM 04/30/2023    8:16 AM 10/20/2014   11:12 AM  Fall Risk   Falls in the past year? 0 0 No  Number falls in past yr: 0  Injury with Fall? 0 0   Risk for fall due to : No Fall Risks No Fall Risks   Follow up Education provided;Falls prevention discussed Falls  evaluation completed     MEDICARE RISK AT HOME: Medicare Risk at Home Any stairs in or around the home?: No If so, are there any without handrails?: No Home free of loose throw rugs in walkways, pet beds, electrical cords, etc?: Yes Adequate lighting in your home to reduce risk of falls?: Yes Life alert?: No Use of a cane, walker or w/c?: No Grab bars in the bathroom?: No Shower chair or bench in shower?: Yes Elevated toilet seat or a handicapped toilet?: Yes  TIMED UP AND GO:  Was the test performed? No    Cognitive Function:        06/18/2023    9:35 AM  6CIT Screen  What Year? 0 points  What month? 0 points  What time? 0 points  Count back from 20 0 points  Months in reverse 0 points  Repeat phrase 0 points  Total Score 0 points    Immunizations Immunization History  Administered Date(s) Administered   Covid-19, Mrna,Vaccine(Spikevax)71yrs and older 07/20/2022   Fluad Quad(high Dose 65+) 07/31/2022   Hepatitis B, ADULT 11/10/2007   Influenza Split 07/29/2015   Influenza, High Dose Seasonal PF 07/30/2016, 07/30/2016, 07/29/2017, 07/26/2020   Influenza, Seasonal, Injecte, Preservative Fre 07/10/2013   Influenza-Unspecified 08/03/2011, 08/03/2011, 07/11/2015, 07/29/2015, 07/30/2016, 07/29/2017   Moderna Covid-19 Vaccine Bivalent Booster 33yrs & up 07/11/2021   Moderna Sars-Covid-2 Vaccination 11/18/2019, 12/16/2019, 06/11/2020, 12/05/2020   Pneumococcal Conjugate-13 08/08/2015   Pneumococcal Polysaccharide-23 01/14/2013, 01/14/2013, 07/29/2017, 07/29/2017   Rsv, Bivalent, Protein Subunit Rsvpref,pf Verdis Frederickson) 06/21/2022   Zoster Recombinant(Shingrix) 09/11/2017, 09/30/2017, 12/01/2017   Zoster, Live 10/16/2012    TDAP status: Up to date  Flu Vaccine status: Up to date  Pneumococcal vaccine status: Up to date  Covid-19 vaccine status: Completed vaccines  Qualifies for Shingles Vaccine? Yes   Zostavax completed Yes   Shingrix Completed?: Yes  Screening  Tests Health Maintenance  Topic Date Due   DTaP/Tdap/Td (1 - Tdap) Never done   Colonoscopy  Never done   INFLUENZA VACCINE  05/30/2023   COVID-19 Vaccine (7 - 2023-24 season) 07/01/2023 (Originally 11/19/2022)   HEMOGLOBIN A1C  10/31/2023   OPHTHALMOLOGY EXAM  11/01/2023   FOOT EXAM  12/08/2023   Diabetic kidney evaluation - eGFR measurement  04/29/2024   Diabetic kidney evaluation - Urine ACR  04/29/2024   Medicare Annual Wellness (AWV)  06/17/2024   Pneumonia Vaccine 77+ Years old  Completed   Zoster Vaccines- Shingrix  Completed   HPV VACCINES  Aged Out   Hepatitis C Screening  Discontinued    Health Maintenance  Health Maintenance Due  Topic Date Due   DTaP/Tdap/Td (1 - Tdap) Never done   Colonoscopy  Never done   INFLUENZA VACCINE  05/30/2023    Colorectal cancer screening: Type of screening: Colonoscopy. Completed yes. Repeat every 10 years  Lung Cancer Screening: (Low Dose CT Chest recommended if Age 71-80 years, 20 pack-year currently smoking OR have quit w/in 15years.) does not qualify.   Lung Cancer Screening Referral: no  Additional Screening: no Hepatitis C Screening: does not qualify; Completed yes  Vision Screening: Recommended annual ophthalmology exams for early detection of glaucoma and other disorders of the eye. Is the patient up to date with their annual eye exam?  Yes  Who is the provider or what is the name of the  office in which the patient attends annual eye exams? [facility]  If pt is not established with a provider, would they like to be referred to a provider to establish care? No .   Dental Screening: Recommended annual dental exams for proper oral hygiene  Diabetic Foot Exam: Diabetic Foot Exam: Completed yes  Community Resource Referral / Chronic Care Management: CRR required this visit?  No   CCM required this visit?  No    Plan:     I have personally reviewed and noted the following in the patient's chart:   Medical and  social history Use of alcohol, tobacco or illicit drugs  Current medications and supplements including opioid prescriptions. Patient is not currently taking opioid prescriptions. Functional ability and status Nutritional status Physical activity Advanced directives List of other physicians Hospitalizations, surgeries, and ER visits in previous 12 months Vitals Screenings to include cognitive, depression, and falls Referrals and appointments  In addition, I have reviewed and discussed with patient certain preventive protocols, quality metrics, and best practice recommendations. A written personalized care plan for preventive services as well as general preventive health recommendations were provided to patient.     Sue Lush, LPN   1/61/0960   After Visit Summary: (MyChart) Due to this being a telephonic visit, the after visit summary with patients personalized plan was offered to patient via MyChart   Nurse Notes: The patient states he is doing well and has no concerns or questions at this time.

## 2023-06-18 NOTE — Patient Instructions (Signed)
[name] , Thank you for taking time to come for your Medicare Wellness Visit. I appreciate your ongoing commitment to your health goals. Please review the following plan we discussed and let me know if I can assist you in the future.   Referrals/Orders/Follow-Ups/Clinician Recommendations: none  This is a list of the screening recommended for you and due dates:  Health Maintenance  Topic Date Due   DTaP/Tdap/Td vaccine (1 - Tdap) Never done   Colon Cancer Screening  Never done   Flu Shot  05/30/2023   COVID-19 Vaccine (7 - 2023-24 season) 07/01/2023*   Hemoglobin A1C  10/31/2023   Eye exam for diabetics  11/01/2023   Complete foot exam   12/08/2023   Yearly kidney function blood test for diabetes  04/29/2024   Yearly kidney health urinalysis for diabetes  04/29/2024   Medicare Annual Wellness Visit  06/17/2024   Pneumonia Vaccine  Completed   Zoster (Shingles) Vaccine  Completed   HPV Vaccine  Aged Out   Hepatitis C Screening  Discontinued  *Topic was postponed. The date shown is not the original due date.    Advanced directives: (Copy Requested) Please bring a copy of your health care power of attorney and living will to the office to be added to your chart at your convenience.  Next Medicare Annual Wellness Visit scheduled for next year: Yes 06/22/24 @ 9:15am telephone  Preventive Care 65 Years and Older, Male  Preventive care refers to lifestyle choices and visits with your health care provider that can promote health and wellness. What does preventive care include? A yearly physical exam. This is also called an annual well check. Dental exams once or twice a year. Routine eye exams. Ask your health care provider how often you should have your eyes checked. Personal lifestyle choices, including: Daily care of your teeth and gums. Regular physical activity. Eating a healthy diet. Avoiding tobacco and drug use. Limiting alcohol use. Practicing safe sex. Taking low doses of  aspirin every day. Taking vitamin and mineral supplements as recommended by your health care provider. What happens during an annual well check? The services and screenings done by your health care provider during your annual well check will depend on your age, overall health, lifestyle risk factors, and family history of disease. Counseling  Your health care provider may ask you questions about your: Alcohol use. Tobacco use. Drug use. Emotional well-being. Home and relationship well-being. Sexual activity. Eating habits. History of falls. Memory and ability to understand (cognition). Work and work Astronomer. Screening  You may have the following tests or measurements: Height, weight, and BMI. Blood pressure. Lipid and cholesterol levels. These may be checked every 5 years, or more frequently if you are over 40 years old. Skin check. Lung cancer screening. You may have this screening every year starting at age 29 if you have a 30-pack-year history of smoking and currently smoke or have quit within the past 15 years. Fecal occult blood test (FOBT) of the stool. You may have this test every year starting at age 40. Flexible sigmoidoscopy or colonoscopy. You may have a sigmoidoscopy every 5 years or a colonoscopy every 10 years starting at age 61. Prostate cancer screening. Recommendations will vary depending on your family history and other risks. Hepatitis C blood test. Hepatitis B blood test. Sexually transmitted disease (STD) testing. Diabetes screening. This is done by checking your blood sugar (glucose) after you have not eaten for a while (fasting). You may have this done  every 1-3 years. Abdominal aortic aneurysm (AAA) screening. You may need this if you are a current or former smoker. Osteoporosis. You may be screened starting at age 43 if you are at high risk. Talk with your health care provider about your test results, treatment options, and if necessary, the need for more  tests. Vaccines  Your health care provider may recommend certain vaccines, such as: Influenza vaccine. This is recommended every year. Tetanus, diphtheria, and acellular pertussis (Tdap, Td) vaccine. You may need a Td booster every 10 years. Zoster vaccine. You may need this after age 52. Pneumococcal 13-valent conjugate (PCV13) vaccine. One dose is recommended after age 76. Pneumococcal polysaccharide (PPSV23) vaccine. One dose is recommended after age 58. Talk to your health care provider about which screenings and vaccines you need and how often you need them. This information is not intended to replace advice given to you by your health care provider. Make sure you discuss any questions you have with your health care provider. Document Released: 11/11/2015 Document Revised: 07/04/2016 Document Reviewed: 08/16/2015 Elsevier Interactive Patient Education  2017 [employer].  Fall Prevention in the Home Falls can cause injuries. They can happen to people of all ages. There are many things you can do to make your home safe and to help prevent falls. What can I do on the outside of my home? Regularly fix the edges of walkways and driveways and fix any cracks. Remove anything that might make you trip as you walk through a door, such as a raised step or threshold. Trim any bushes or trees on the path to your home. Use bright outdoor lighting. Clear any walking paths of anything that might make someone trip, such as rocks or tools. Regularly check to see if handrails are loose or broken. Make sure that both sides of any steps have handrails. Any raised decks and porches should have guardrails on the edges. Have any leaves, snow, or ice cleared regularly. Use sand or salt on walking paths during winter. Clean up any spills in your garage right away. This includes oil or grease spills. What can I do in the bathroom? Use night lights. Install grab bars by the toilet and in the tub and shower.  Do not use towel bars as grab bars. Use non-skid mats or decals in the tub or shower. If you need to sit down in the shower, use a plastic, non-slip stool. Keep the floor dry. Clean up any water that spills on the floor as soon as it happens. Remove soap buildup in the tub or shower regularly. Attach bath mats securely with double-sided non-slip rug tape. Do not have throw rugs and other things on the floor that can make you trip. What can I do in the bedroom? Use night lights. Make sure that you have a light by your bed that is easy to reach. Do not use any sheets or blankets that are too big for your bed. They should not hang down onto the floor. Have a firm chair that has side arms. You can use this for support while you get dressed. Do not have throw rugs and other things on the floor that can make you trip. What can I do in the kitchen? Clean up any spills right away. Avoid walking on wet floors. Keep items that you use a lot in easy-to-reach places. If you need to reach something above you, use a strong step stool that has a grab bar. Keep electrical cords out of the  way. Do not use floor polish or wax that makes floors slippery. If you must use wax, use non-skid floor wax. Do not have throw rugs and other things on the floor that can make you trip. What can I do with my stairs? Do not leave any items on the stairs. Make sure that there are handrails on both sides of the stairs and use them. Fix handrails that are broken or loose. Make sure that handrails are as long as the stairways. Check any carpeting to make sure that it is firmly attached to the stairs. Fix any carpet that is loose or worn. Avoid having throw rugs at the top or bottom of the stairs. If you do have throw rugs, attach them to the floor with carpet tape. Make sure that you have a light switch at the top of the stairs and the bottom of the stairs. If you do not have them, ask someone to add them for you. What else  can I do to help prevent falls? Wear shoes that: Do not have high heels. Have rubber bottoms. Are comfortable and fit you well. Are closed at the toe. Do not wear sandals. If you use a stepladder: Make sure that it is fully opened. Do not climb a closed stepladder. Make sure that both sides of the stepladder are locked into place. Ask someone to hold it for you, if possible. Clearly mark and make sure that you can see: Any grab bars or handrails. First and last steps. Where the edge of each step is. Use tools that help you move around (mobility aids) if they are needed. These include: Canes. Walkers. Scooters. Crutches. Turn on the lights when you go into a dark area. Replace any light bulbs as soon as they burn out. Set up your furniture so you have a clear path. Avoid moving your furniture around. If any of your floors are uneven, fix them. If there are any pets around you, be aware of where they are. Review your medicines with your doctor. Some medicines can make you feel dizzy. This can increase your chance of falling. Ask your doctor what other things that you can do to help prevent falls. This information is not intended to replace advice given to you by your health care provider. Make sure you discuss any questions you have with your health care provider. Document Released: 08/11/2009 Document Revised: 03/22/2016 Document Reviewed: 11/19/2014 Elsevier Interactive Patient Education  2017 [employer].

## 2023-06-21 ENCOUNTER — Other Ambulatory Visit: Payer: Self-pay | Admitting: Family Medicine

## 2023-07-15 ENCOUNTER — Other Ambulatory Visit: Payer: Self-pay | Admitting: Family Medicine

## 2023-07-15 DIAGNOSIS — E1122 Type 2 diabetes mellitus with diabetic chronic kidney disease: Secondary | ICD-10-CM

## 2023-07-16 ENCOUNTER — Other Ambulatory Visit: Payer: Self-pay | Admitting: Family Medicine

## 2023-07-16 DIAGNOSIS — Z794 Long term (current) use of insulin: Secondary | ICD-10-CM

## 2023-07-16 NOTE — Telephone Encounter (Signed)
Requested Prescriptions  Pending Prescriptions Disp Refills   Continuous Glucose Sensor (FREESTYLE LIBRE 3 PLUS SENSOR) MISC [Pharmacy Med Name: FREESTYLE LIBRE 3 PLUS SENSOR] 2 each 3    Sig: PLACE 1 SENSOR ON THE SKIN EVERY 14 DAYS. USE TO CHECK GLUCOSE CONTINUOUSLY     There is no refill protocol information for this order

## 2023-07-17 NOTE — Telephone Encounter (Signed)
Patients insurance is requiring a prior authorization for the sensors.

## 2023-07-19 ENCOUNTER — Other Ambulatory Visit: Payer: Self-pay | Admitting: Family Medicine

## 2023-07-19 NOTE — Telephone Encounter (Signed)
Medication Refill - Medication: Lisinapril 40 mg he is completely out  Has the patient contacted their pharmacy? Yes.   (Agent: If no, request that the patient contact the pharmacy for the refill. If patient does not wish to contact the pharmacy document the reason why and proceed with request.) (Agent: If yes, when and what did the pharmacy advise?)  Preferred Pharmacy (with phone number or street name): [facility] Has the patient been seen for an appointment in the last year OR does the patient have an upcoming appointment? Yes.    Agent: Please be advised that RX refills may take up to 3 business days. We ask that you follow-up with your pharmacy.

## 2023-07-20 ENCOUNTER — Other Ambulatory Visit: Payer: Self-pay | Admitting: Family Medicine

## 2023-07-22 MED ORDER — LISINOPRIL 40 MG PO TABS: ORAL_TABLET | ORAL | 0 refills | Status: DC

## 2023-07-22 NOTE — Telephone Encounter (Signed)
Requested medication (s) are due for refill today: routing for review  Requested medication (s) are on the active medication list: yes  Last refill:  unknown  Future visit scheduled: yes  Notes to clinic:  Unable to refill per protocol, last refill by historical provider.      Requested Prescriptions  Pending Prescriptions Disp Refills   lisinopril (ZESTRIL) 30 MG tablet [Pharmacy Med Name: LISINOPRIL 30 MG TABLET] 90 tablet     Sig: TAKE 1 TABLET BY MOUTH EVERY MORNING     Cardiovascular:  ACE Inhibitors Failed - 07/19/2023  4:19 PM      Failed - Last BP in normal range    BP Readings from Last 1 Encounters:  05/28/23 (!) 140/77         Passed - Cr in normal range and within 180 days    Creatinine  Date Value Ref Range Status  12/22/2013 0.98 0.60 - 1.30 mg/dL Final   Creatinine, Ser  Date Value Ref Range Status  04/30/2023 1.04 0.76 - 1.27 mg/dL Final         Passed - K in normal range and within 180 days    Potassium  Date Value Ref Range Status  04/30/2023 4.3 3.5 - 5.2 mmol/L Final  12/22/2013 4.4 3.5 - 5.1 mmol/L Final         Passed - Patient is not pregnant      Passed - Valid encounter within last 6 months    Recent Outpatient Visits           1 month ago Hypertension associated with diabetes [facility])   [place] [facility] P[facility]ue, Monico Blitz, DO   2 months ago Annual physical exam   [facility] Pardue, Monico Blitz, DO       Future Appointments             In 1 week Kirke Corin, Chelsea Aus, MD [facility] [place]   In 4 months Pardue, Monico Blitz, DO [place] [facility], [facility]

## 2023-07-22 NOTE — Telephone Encounter (Signed)
Requested medication (s) are due for refill today: yes  Requested medication (s) are on the active medication list: yes  Last refill:  05/28/23  Future visit scheduled: yes  Notes to clinic:  Unable to refill per protocol, last refill by historical provider.      Requested Prescriptions  Pending Prescriptions Disp Refills   lisinopril (ZESTRIL) 40 MG tablet      Sig: Take 1 tablet (40 mg total) by mouth daily.     Cardiovascular:  ACE Inhibitors Failed - 07/19/2023 12:29 PM      Failed - Last BP in normal range    BP Readings from Last 1 Encounters:  05/28/23 (!) 140/77         Passed - Cr in normal range and within 180 days    Creatinine  Date Value Ref Range Status  12/22/2013 0.98 0.60 - 1.30 mg/dL Final   Creatinine, Ser  Date Value Ref Range Status  04/30/2023 1.04 0.76 - 1.27 mg/dL Final         Passed - K in normal range and within 180 days    Potassium  Date Value Ref Range Status  04/30/2023 4.3 3.5 - 5.2 mmol/L Final  12/22/2013 4.4 3.5 - 5.1 mmol/L Final         Passed - Patient is not pregnant      Passed - Valid encounter within last 6 months    Recent Outpatient Visits           1 month ago Hypertension associated with diabetes [facility])   [place] [facility] Pardue, Monico Blitz, DO   2 months ago Annual physical exam   [facility] P[facility]ue, Monico Blitz, DO       Future Appointments             In 1 week Kirke Corin, Chelsea Aus, MD [place] [facility] [place]   In 4 months Pardue, Monico Blitz, DO [place] [facility], [facility]

## 2023-07-23 NOTE — Telephone Encounter (Signed)
Historical provider.  

## 2023-07-23 NOTE — Telephone Encounter (Signed)
Requested medication (s) are due for refill today: yes both  Requested medication (s) are on the active medication list: yes both meds    Last refill: Pioglitazone  12/23/2017     Pantoprazole  06/03/2012   Amounts not specified  Future visit scheduled yes 11/28/23  Notes to clinic:Historical providers, not delegated.  Requested Prescriptions  Pending Prescriptions Disp Refills   pioglitazone (ACTOS) 45 MG tablet [Pharmacy Med Name: PIOGLITAZONE HCL 45 MG TABLET] 90 tablet     Sig: TAKE 1 TABLET BY MOUTH EVERY MORNING     Endocrinology:  Diabetes - Glitazones - pioglitazone Passed - 07/20/2023 10:42 AM      Passed - HBA1C is between 0 and 7.9 and within 180 days    Hgb A1c MFr Bld  Date Value Ref Range Status  04/30/2023 7.6 (H) 4.8 - 5.6 % Final    Comment:             Prediabetes: 5.7 - 6.4          Diabetes: >6.4          Glycemic control for adults with diabetes: <7.0          Passed - Valid encounter within last 6 months    Recent Outpatient Visits           1 month ago Hypertension associated with diabetes [facility])   [place] [facility] Pardue, Monico Blitz, DO   2 months ago Annual physical exam   [facility] Pardue, Monico Blitz, DO       Future Appointments             In 1 week Kirke Corin, Chelsea Aus, MD [facility] [place]   In 4 months Pardue, Monico Blitz, DO [place] [facility], [facility]             pantoprazole (PROTONIX) 40 MG tablet [Pharmacy Med Name: PANTOPRAZOLE SOD DR 40 MG TAB] 90 tablet     Sig: TAKE 1 TABLET BY MOUTH EVERY DAY     Gastroenterology: Proton Pump Inhibitors Passed - 07/20/2023 10:42 AM      Passed - Valid encounter within last 12 months    Recent Outpatient Visits           1 month ago Hypertension associated with diabetes [facility])   [place] [facility] Pardue, Monico Blitz, DO   2 months ago Annual physical exam   [facility] Pardue,  Monico Blitz, DO       Future Appointments             In 1 week Kirke Corin, Chelsea Aus, MD [place] [facility] [place]   In 4 months Pardue, Monico Blitz, DO  [facility], [facility]

## 2023-08-01 ENCOUNTER — Ambulatory Visit: Payer: Medicare Other | Attending: Cardiovascular Disease | Admitting: Cardiovascular Disease

## 2023-08-01 ENCOUNTER — Encounter: Payer: Self-pay | Admitting: Cardiovascular Disease

## 2023-08-01 VITALS — BP 128/60 | HR 68 | Ht 70.0 in | Wt 237.2 lb

## 2023-08-01 DIAGNOSIS — I201 Angina pectoris with documented spasm: Secondary | ICD-10-CM | POA: Diagnosis not present

## 2023-08-01 DIAGNOSIS — I152 Hypertension secondary to endocrine disorders: Secondary | ICD-10-CM | POA: Diagnosis not present

## 2023-08-01 DIAGNOSIS — E785 Hyperlipidemia, unspecified: Secondary | ICD-10-CM

## 2023-08-01 DIAGNOSIS — E1159 Type 2 diabetes mellitus with other circulatory complications: Secondary | ICD-10-CM | POA: Diagnosis not present

## 2023-08-01 MED ORDER — EZETIMIBE 10 MG PO TABS
10.0000 mg | ORAL_TABLET | Freq: Every day | ORAL | 3 refills | Status: DC
Start: 2023-08-01 — End: 2024-07-08

## 2023-08-01 NOTE — Patient Instructions (Signed)
Medication Instructions:  START Ezetimibe (Zetia) 10 mg once daily  *If you need a refill on your cardiac medications before your next appointment, please call your pharmacy*   Lab Work: None ordered If you have labs (blood work) drawn today and your tests are completely normal, you will receive your results only by: MyChart Message (if you have MyChart) OR A paper copy in the mail If you have any lab test that is abnormal or we need to change your treatment, we will call you to review the results.   Testing/Procedures: None ordered   Follow-Up: At [facility], you and your health needs are our priority.  As part of our continuing mission to provide you with exceptional heart care, we have created designated Provider Care Teams.  These Care Teams include your primary Cardiologist (physician) and Advanced Practice Providers (APPs -  Physician Assistants and Nurse Practitioners) who all work together to provide you with the care you need, when you need it.  We recommend signing up for the patient portal called "MyChart".  Sign up information is provided on this After Visit Summary.  MyChart is used to connect with patients for Virtual Visits (Telemedicine).  Patients are able to view lab/test results, encounter notes, upcoming appointments, etc.  Non-urgent messages can be sent to your provider as well.   To learn more about what you can do with MyChart, go to [email or web address].    Your next appointment:   6 month(s)  Provider:   You may see Dr. Kirke Corin or one of the following Advanced Practice Providers on your designated Care Team:   Nicolasa Ducking, NP Eula Listen, PA-C Cadence Fransico Michael, PA-C Charlsie Quest, NP

## 2023-08-01 NOTE — Progress Notes (Signed)
Cardiology Office Note   Date:  08/01/2023   ID:  [name] [name], DOB 15-Oct-1951, MRN [id number]  PCP:  Sherlyn Hay, DO  Cardiologist:   Lorine Bears, MD   Chief Complaint  Patient presents with   New Patient (Initial Visit)    Establish care for Prinzmetal variant angina. 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 Patient c/o chest pain & shortness of breath with mild exertion. Medications reviewed by the patient verbally.        History of Present Illness: [name] [name] is a 72 y.o. male who presents to establish cardiovascular care.  He has history of diabetes mellitus, GERD, hyperlipidemia and family history of coronary artery disease. He used to follow-up with [facility] and was last seen there in 2015.  He was seen there for chest pain and shortness of breath.  Stress echocardiogram was unremarkable.  He had cardiac catheterization twice in the remote past that showed no obstructive coronary artery disease but he was suspected of having coronary artery spasm and he carries the diagnosis of Prinzmetal angina.  This was managed by verapamil. He was seen by Dr. Rennis Golden in 2018 and reported some chest discomfort at that time.  He underwent a nuclear stress test which showed no evidence of ischemia with normal ejection fraction. He has been doing reasonably well and reports minimal chest pain at this time.  He does have sublingual nitroglycerin at home but has not required this lately.  He reports stable exertional dyspnea and he tries to walk daily for exercise.  He has chronic lower extremity edema that improved with increasing the dose of hydrochlorothiazide.  Past Medical History:  Diagnosis Date   Arthritis    some arthritis and djd left knee - s/p partial left knee replacement -now has pain left knee, and knee gives away   Complication of anesthesia    uvula swelling--day after surgery-could not swallow--required steroids--this was after knee surgery 2010    Coronary artery spasm (HCC)    diagnosed in the 7o's after chest pains--pt states heart cath negative--was given verapamil to take to prevent further spasms and pt states he has not had any other problems   Diabetes mellitus    GERD (gastroesophageal reflux disease)    Glaucoma    Hyperlipidemia    Hypertension associated with diabetes (HCC) 05/03/2023   Pericarditis 2012   tx'd at [facility]--with motrin 800 mg--hospitalized over night--no problems since-   Periodic edema     Past Surgical History:  Procedure Laterality Date   ANTERIOR FUSION CERVICAL SPINE  06/30/1979   CHOLECYSTECTOMY     FOREIGN BODY REMOVAL  06/16/2012   Procedure: REMOVAL FOREIGN BODY EXTREMITY;  Surgeon: Shelda Pal, MD;  Location: [facility];  Service: Orthopedics;  Laterality: Left;   JOINT REPLACEMENT     left partial knee replacement  10/29/2008   surgery [facility] in [place], [place]   nasal septal recontruction  10/30/2011   SPINE SURGERY     TOTAL KNEE REVISION  06/16/2012   Procedure: TOTAL KNEE REVISION;  Surgeon: Shelda Pal, MD;  Location: [facility];  Service: Orthopedics;  Laterality: Left;  Revision Left Partial Knee Femoral Component     Current Outpatient Medications  Medication Sig Dispense Refill   aspirin EC 81 MG tablet Take 81 mg by mouth daily.      atorvastatin (LIPITOR) 40 MG tablet Take 1 tablet (40 mg total) by mouth daily. 90 tablet 3  Continuous Glucose Sensor (FREESTYLE LIBRE 3 SENSOR) MISC PLACE 1 SENSOR ON THE SKIN EVERY 14 DAYS. USE TO CHECK GLUCOSE CONTINUOUSLY 2 each 3   hydrochlorothiazide (HYDRODIURIL) 25 MG tablet Take 1 tablet (25 mg total) by mouth daily. 90 tablet 3   insulin glargine (LANTUS) 100 UNIT/ML injection Inject 65 Units into the skin at bedtime.     insulin lispro (HUMALOG KWIKPEN) 100 UNIT/ML KiwkPen Inject 18 Units into the skin 3 (three) times daily.      latanoprost (XALATAN) 0.005 % ophthalmic solution USE 1 DROP IN BOTH EYES AT BEDTIME  1    lisinopril (ZESTRIL) 40 MG tablet Take 40 mg by mouth daily. 90 tablet 0   metFORMIN (GLUCOPHAGE) 1000 MG tablet TAKE 1 TABLET BY MOUTH EVERY MORNING AND 1 AT BEDTIME 180 tablet 1   nitroGLYCERIN (NITROSTAT) 0.4 MG SL tablet PLACE 1 TABLET UNDER THE TONGUE EVERY 5 MINUTES AS NEEDED FOR CHEST PAIN, SEEK MEDICAL ATTENTION IF NO RELIEF AFTER 2ND DOSE 250 tablet 3   pantoprazole (PROTONIX) 40 MG tablet TAKE 1 TABLET BY MOUTH EVERY DAY 90 tablet 3   pioglitazone (ACTOS) 45 MG tablet TAKE 1 TABLET BY MOUTH EVERY MORNING 90 tablet 3   verapamil (CALAN-SR) 240 MG CR tablet Take 1 tablet (240 mg total) by mouth daily. 90 tablet 1   No current facility-administered medications for this visit.    Allergies:   Azithromycin, Iodinated contrast media, Other, Doxycycline calcium, and Vancomycin    Social History:  The patient  reports that he has never smoked. He has never used smokeless tobacco. He reports that he does not drink alcohol and does not use drugs.   Family History:  The patient's family history includes Coronary artery disease in his father; Deep vein thrombosis in his maternal grandmother; Diabetes in his mother; Diabetes Mellitus II in his mother; Heart attack in his mother; Heart disease in his father and mother; Hypertension in his father and mother; Kidney disease in his father and mother; Vision loss in his mother.    ROS:  Please see the history of present illness.   Otherwise, review of systems are positive for none.   All other systems are reviewed and negative.    PHYSICAL EXAM: VS:  BP 128/60 (BP Location: Right Arm, Patient Position: Sitting, Cuff Size: Normal)   Pulse 68   Ht 5\' 10"  (1.778 m)   Wt 237 lb 4 oz (107.6 kg)   SpO2 98%   BMI 34.04 kg/m  , BMI Body mass index is 34.04 kg/m. GEN: Well nourished, well developed, in no acute distress  HEENT: normal  Neck: no JVD, carotid bruits, or masses Cardiac: RRR; no murmurs, rubs, or gallops, trace lower extremity  edema Respiratory:  clear to auscultation bilaterally, normal work of breathing GI: soft, nontender, nondistended, + BS MS: no deformity or atrophy  Skin: warm and dry, no rash Neuro:  Strength and sensation are intact Psych: euthymic mood, full affect   EKG:  EKG is ordered today. The ekg ordered today demonstrates : Normal sinus rhythm Minimal voltage criteria for LVH, may be normal variant ( R in aVL ) Nonspecific T wave abnormality    Recent Labs: 04/30/2023: ALT 30; BUN 11; Creatinine, Ser 1.04; Potassium 4.3; Sodium 143    Lipid Panel    Component Value Date/Time   CHOL 163 04/30/2023 0932   TRIG 114 04/30/2023 0932   HDL 59 04/30/2023 0932   CHOLHDL 2.8 04/30/2023 0932   LDLCALC 84  04/30/2023 0932      Wt Readings from Last 3 Encounters:  08/01/23 237 lb 4 oz (107.6 kg)  06/18/23 233 lb (105.7 kg)  05/28/23 233 lb (105.7 kg)          08/01/2023    9:13 AM 03/14/2017    9:04 AM  PAD Screen  Previous PAD dx? No No  Previous surgical procedure? No No  Pain with walking? No No  Feet/toe relief with dangling? No No  Painful, non-healing ulcers? No No  Extremities discolored? No No      ASSESSMENT AND PLAN:  1.  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  His symptoms have been stable for years and thus no need for ischemic cardiac evaluation at this time.  Continue low-dose aspirin.  2.  Hyperlipidemia: Most recent lipid profile showed an LDL of 84.  Given that he is diabetic, recommend a target LDL of less than 70.  I elected to add ezetimibe 10 mg daily.  3.  Essential hypertension: Blood pressure is well-controlled on current medications.  4.  Diabetes mellitus: Most recent hemoglobin A1c was 7.4.    Disposition:   FU with me in 6 months  Signed,  Lorine Bears, MD  08/01/2023 9:23 AM    [facility]

## 2023-08-14 ENCOUNTER — Other Ambulatory Visit: Payer: Self-pay | Admitting: Family Medicine

## 2023-08-14 DIAGNOSIS — E1122 Type 2 diabetes mellitus with diabetic chronic kidney disease: Secondary | ICD-10-CM

## 2023-08-14 NOTE — Telephone Encounter (Signed)
Requested Prescriptions  Pending Prescriptions Disp Refills   lisinopril (ZESTRIL) 40 MG tablet [Pharmacy Med Name: LISINOPRIL 40 MG TABLET] 90 tablet 0    Sig: TAKE 40 MG BY MOUTH DAILY.     Cardiovascular:  ACE Inhibitors Passed - 08/14/2023  9:17 AM      Passed - Cr in normal range and within 180 days    Creatinine  Date Value Ref Range Status  12/22/2013 0.98 0.60 - 1.30 mg/dL Final   Creatinine, Ser  Date Value Ref Range Status  04/30/2023 1.04 0.76 - 1.27 mg/dL Final         Passed - K in normal range and within 180 days    Potassium  Date Value Ref Range Status  04/30/2023 4.3 3.5 - 5.2 mmol/L Final  12/22/2013 4.4 3.5 - 5.1 mmol/L Final         Passed - Patient is not pregnant      Passed - Last BP in normal range    BP Readings from Last 1 Encounters:  08/01/23 128/60         Passed - Valid encounter within last 6 months    Recent Outpatient Visits           2 months ago Hypertension associated with diabetes [facility])   [place] [facility] P[facility]ue, Monico Blitz, DO   3 months ago Annual physical exam   [facility] Pardue, Monico Blitz, DO       Future Appointments             In 3 months Pardue, Monico Blitz, DO [place] [facility], [facility]   In 5 months Dunn, Raymon Mutton, PA-C [place] [facility]             LANTUS SOLOSTAR 100 UNIT/ML Solostar Pen [employer] Med Name: LANTUS SOLOSTAR 100 UNIT/ML]  5    Sig: INJECT 65 UNITS UNDER THE SKIN ONCE DAILY     Endocrinology:  Diabetes - Insulins Passed - 08/14/2023  9:17 AM      Passed - HBA1C is between 0 and 7.9 and within 180 days    Hgb A1c MFr Bld  Date Value Ref Range Status  04/30/2023 7.6 (H) 4.8 - 5.6 % Final    Comment:             Prediabetes: 5.7 - 6.4          Diabetes: >6.4          Glycemic control for adults with diabetes: <7.0          Passed - Valid encounter within last 6 months    Recent Outpatient Visits           2  months ago Hypertension associated with diabetes [facility])   [place] [facility] Pardue, Monico Blitz, DO   3 months ago Annual physical exam   [facility] Pardue, Monico Blitz, DO       Future Appointments             In 3 months Pardue, Monico Blitz, DO [facility], [facility]   In 5 months Dunn, Raymon Mutton, PA-C [place] [facility] at [facility]

## 2023-08-14 NOTE — Telephone Encounter (Signed)
Requested medication (s) are due for refill today:   Requested medication (s) are on the active medication list: Yes  Last refill:    Future visit scheduled: Yes  Notes to clinic:  Historical provider.    Requested Prescriptions  Pending Prescriptions Disp Refills   LANTUS SOLOSTAR 100 UNIT/ML Solostar Pen [Pharmacy Med Name: LANTUS SOLOSTAR 100 UNIT/ML]  5    Sig: INJECT 65 UNITS UNDER THE SKIN ONCE DAILY     Endocrinology:  Diabetes - Insulins Passed - 08/14/2023  9:17 AM      Passed - HBA1C is between 0 and 7.9 and within 180 days    Hgb A1c MFr Bld  Date Value Ref Range Status  04/30/2023 7.6 (H) 4.8 - 5.6 % Final    Comment:             Prediabetes: 5.7 - 6.4          Diabetes: >6.4          Glycemic control for adults with diabetes: <7.0          Passed - Valid encounter within last 6 months    Recent Outpatient Visits           2 months ago Hypertension associated with diabetes [facility])   [place] [facility] Pardue, Monico Blitz, DO   3 months ago Annual physical exam   [facility] Pardue, Monico Blitz, DO       Future Appointments             In 3 months Pardue, Monico Blitz, DO [place] [facility], [facility]   In 5 months Dunn, Raymon Mutton, PA-C [place] [facility] at [employer] Prescriptions Disp Refills   lisinopril (ZESTRIL) 40 MG tablet 90 tablet 0    Sig: TAKE 40 MG BY MOUTH DAILY.     Cardiovascular:  ACE Inhibitors Passed - 08/14/2023  9:17 AM      Passed - Cr in normal range and within 180 days    Creatinine  Date Value Ref Range Status  12/22/2013 0.98 0.60 - 1.30 mg/dL Final   Creatinine, Ser  Date Value Ref Range Status  04/30/2023 1.04 0.76 - 1.27 mg/dL Final         Passed - K in normal range and within 180 days    Potassium  Date Value Ref Range Status  04/30/2023 4.3 3.5 - 5.2 mmol/L Final  12/22/2013 4.4 3.5 - 5.1 mmol/L Final         Passed - Patient is not  pregnant      Passed - Last BP in normal range    BP Readings from Last 1 Encounters:  08/01/23 128/60         Passed - Valid encounter within last 6 months    Recent Outpatient Visits           2 months ago Hypertension associated with diabetes [facility])   [place] [facility] Pardue, Monico Blitz, DO   3 months ago Annual physical exam   [facility] Pardue, Monico Blitz, DO       Future Appointments             In 3 months Pardue, Monico Blitz, DO [place] [facility], [facility]   In 5 months Dunn, Raymon Mutton, PA-C [place] [facility] at [facility]

## 2023-08-28 DIAGNOSIS — D2261 Melanocytic nevi of right upper limb, including shoulder: Secondary | ICD-10-CM | POA: Diagnosis not present

## 2023-08-28 DIAGNOSIS — L918 Other hypertrophic disorders of the skin: Secondary | ICD-10-CM | POA: Diagnosis not present

## 2023-08-28 DIAGNOSIS — Z85828 Personal history of other malignant neoplasm of skin: Secondary | ICD-10-CM | POA: Diagnosis not present

## 2023-08-28 DIAGNOSIS — L538 Other specified erythematous conditions: Secondary | ICD-10-CM | POA: Diagnosis not present

## 2023-08-28 DIAGNOSIS — D225 Melanocytic nevi of trunk: Secondary | ICD-10-CM | POA: Diagnosis not present

## 2023-08-28 DIAGNOSIS — H53451 Other localized visual field defect, right eye: Secondary | ICD-10-CM | POA: Diagnosis not present

## 2023-08-28 DIAGNOSIS — C44319 Basal cell carcinoma of skin of other parts of face: Secondary | ICD-10-CM | POA: Diagnosis not present

## 2023-08-28 DIAGNOSIS — D2272 Melanocytic nevi of left lower limb, including hip: Secondary | ICD-10-CM | POA: Diagnosis not present

## 2023-08-28 DIAGNOSIS — L570 Actinic keratosis: Secondary | ICD-10-CM | POA: Diagnosis not present

## 2023-08-28 DIAGNOSIS — D2262 Melanocytic nevi of left upper limb, including shoulder: Secondary | ICD-10-CM | POA: Diagnosis not present

## 2023-08-28 DIAGNOSIS — D485 Neoplasm of uncertain behavior of skin: Secondary | ICD-10-CM | POA: Diagnosis not present

## 2023-08-28 DIAGNOSIS — L905 Scar conditions and fibrosis of skin: Secondary | ICD-10-CM | POA: Diagnosis not present

## 2023-10-03 DIAGNOSIS — M65341 Trigger finger, right ring finger: Secondary | ICD-10-CM | POA: Diagnosis not present

## 2023-10-08 DIAGNOSIS — C44319 Basal cell carcinoma of skin of other parts of face: Secondary | ICD-10-CM | POA: Diagnosis not present

## 2023-10-16 DIAGNOSIS — C44319 Basal cell carcinoma of skin of other parts of face: Secondary | ICD-10-CM | POA: Diagnosis not present

## 2023-11-04 ENCOUNTER — Other Ambulatory Visit: Payer: Self-pay | Admitting: Family Medicine

## 2023-11-06 ENCOUNTER — Other Ambulatory Visit: Payer: Self-pay | Admitting: Family Medicine

## 2023-11-06 DIAGNOSIS — Z794 Long term (current) use of insulin: Secondary | ICD-10-CM

## 2023-11-06 DIAGNOSIS — I201 Angina pectoris with documented spasm: Secondary | ICD-10-CM

## 2023-11-06 DIAGNOSIS — E1159 Type 2 diabetes mellitus with other circulatory complications: Secondary | ICD-10-CM

## 2023-11-28 ENCOUNTER — Encounter: Payer: Self-pay | Admitting: Family Medicine

## 2023-11-28 ENCOUNTER — Ambulatory Visit: Payer: Medicare Other | Admitting: Family Medicine

## 2023-11-28 VITALS — BP 128/60 | HR 63 | Resp 16 | Ht 71.0 in | Wt 241.8 lb

## 2023-11-28 DIAGNOSIS — Z09 Encounter for follow-up examination after completed treatment for conditions other than malignant neoplasm: Secondary | ICD-10-CM | POA: Diagnosis not present

## 2023-11-28 DIAGNOSIS — E1159 Type 2 diabetes mellitus with other circulatory complications: Secondary | ICD-10-CM | POA: Diagnosis not present

## 2023-11-28 DIAGNOSIS — I152 Hypertension secondary to endocrine disorders: Secondary | ICD-10-CM | POA: Diagnosis not present

## 2023-11-28 DIAGNOSIS — Z794 Long term (current) use of insulin: Secondary | ICD-10-CM

## 2023-11-28 DIAGNOSIS — I201 Angina pectoris with documented spasm: Secondary | ICD-10-CM

## 2023-11-28 DIAGNOSIS — Z79899 Other long term (current) drug therapy: Secondary | ICD-10-CM | POA: Diagnosis not present

## 2023-11-28 DIAGNOSIS — E782 Mixed hyperlipidemia: Secondary | ICD-10-CM

## 2023-11-28 DIAGNOSIS — E1122 Type 2 diabetes mellitus with diabetic chronic kidney disease: Secondary | ICD-10-CM | POA: Diagnosis not present

## 2023-11-28 DIAGNOSIS — N182 Chronic kidney disease, stage 2 (mild): Secondary | ICD-10-CM

## 2023-11-28 NOTE — Progress Notes (Unsigned)
Established patient visit   Patient: [name] [name]   DOB: 1951/01/21   73 y.o. Male  MRN: [id number] Visit Date: 11/28/2023  Today's healthcare provider: Sherlyn Hay, DO   Chief Complaint  Patient presents with  . Medical Management of Chronic Issues    6 months follow-up   Subjective    HPI He reports stable exertional dyspnea and he tries to walk daily for exercise. He has chronic lower extremity edema that improved with increasing the dose of hydrochlorothiazide.  Ezetimibe added by cardiology   Requested colonoscopy via Epic - check ***  The patient, with diabetes mellitus type 2, presents for a routine follow-up for diabetes management.  Blood sugar levels are stable with a recent seven-day average of 7.6% and a ninety-day average of 7.7%. Occasionally, blood sugar drops to 68-69 mg/dL in the early morning, prompting him to eat to stabilize it. He uses a Libre device for glucose monitoring.  Diabetes management includes Lantus at 65 units at night and Humalog for mealtime injections. He prefers a 30-day supply of insulin but has been receiving a 90-day supply. He has reduced his metformin dose to 500 mg and wants to discontinue it eventually.  He has gained a few pounds over the holidays, attributing this to reduced physical activity due to cold weather and snow. He wants to increase physical activity once the weather improves.  He is up to date with his COVID booster, pneumonia shot, and RSV vaccine. He mentions having received a hepatitis B vaccine series in the past but is unsure if it was completed. ***  {History (Optional):23778}  Medications: Outpatient Medications Prior to Visit  Medication Sig  . aspirin EC 81 MG tablet Take 81 mg by mouth daily.   Marland Kitchen atorvastatin (LIPITOR) 40 MG tablet Take 1 tablet (40 mg total) by mouth daily.  . Continuous Glucose Sensor (FREESTYLE LIBRE 3 SENSOR) MISC PLACE 1 SENSOR ON THE SKIN EVERY 14 DAYS. USE TO CHECK GLUCOSE  CONTINUOUSLY  . hydrochlorothiazide (HYDRODIURIL) 25 MG tablet Take 1 tablet (25 mg total) by mouth daily.  . insulin glargine (LANTUS SOLOSTAR) 100 UNIT/ML Solostar Pen INJECT 65 UNITS UNDER THE SKIN ONCE DAILY  . insulin lispro (HUMALOG KWIKPEN) 100 UNIT/ML KwikPen INJECT 20 UNUTS UNDER THE SKIN EVERY AM, 20 UNITS AT NOON, AND 20 UNITS WITH EVENING MEAL  . latanoprost (XALATAN) 0.005 % ophthalmic solution USE 1 DROP IN BOTH EYES AT BEDTIME  . lisinopril (ZESTRIL) 40 MG tablet TAKE 40 MG BY MOUTH DAILY.  . metFORMIN (GLUCOPHAGE) 1000 MG tablet TAKE 1 TABLET BY MOUTH EVERY MORNING AND 1 AT BEDTIME (Patient taking differently: No sig reported)  . nitroGLYCERIN (NITROSTAT) 0.4 MG SL tablet PLACE 1 TABLET UNDER THE TONGUE EVERY 5 MINUTES AS NEEDED FOR CHEST PAIN, SEEK MEDICAL ATTENTION IF NO RELIEF AFTER 2ND DOSE  . pantoprazole (PROTONIX) 40 MG tablet TAKE 1 TABLET BY MOUTH EVERY DAY  . pioglitazone (ACTOS) 45 MG tablet TAKE 1 TABLET BY MOUTH EVERY MORNING  . verapamil (CALAN-SR) 240 MG CR tablet TAKE 1 TABLET BY MOUTH EVERY DAY  . ezetimibe (ZETIA) 10 MG tablet Take 1 tablet (10 mg total) by mouth daily.   No facility-administered medications prior to visit.    {Insert previous labs (optional):23779} {See past labs  Heme  Chem  Endocrine  Serology  Results Review (optional):1}   Objective    BP 128/60 (BP Location: Right Arm, Patient Position: Sitting, Cuff Size: Large)   Pulse 63  Resp 16   Ht 5\' 11"  (1.803 m)   Wt 241 lb 12.8 oz (109.7 kg)   SpO2 98%   BMI 33.72 kg/m  {Insert last BP/Wt (optional):23777}{See vitals history (optional):1}   Physical Exam   Results for orders placed or performed in visit on 11/28/23  Hemoglobin A1c  Result Value Ref Range   Hgb A1c MFr Bld 9.1 (H) 4.8 - 5.6 %   Est. average glucose Bld gHb Est-mCnc 214 mg/dL  Lipid panel  Result Value Ref Range   Cholesterol, Total 110 100 - 199 mg/dL   Triglycerides 161 0 - 149 mg/dL   HDL 46 >09 mg/dL    VLDL Cholesterol Cal 19 5 - 40 mg/dL   LDL Chol Calc (NIH) 45 0 - 99 mg/dL   Chol/HDL Ratio 2.4 0.0 - 5.0 ratio  Vitamin B12  Result Value Ref Range   Vitamin B-12 202 (L) 232 - 1,245 pg/mL  Basic metabolic panel  Result Value Ref Range   Glucose 123 (H) 70 - 99 mg/dL   BUN 15 8 - 27 mg/dL   Creatinine, Ser 6.04 0.76 - 1.27 mg/dL   eGFR 63 >54 UJ/WJX/9.14   BUN/Creatinine Ratio 12 10 - 24   Sodium 145 (H) 134 - 144 mmol/L   Potassium 5.4 (H) 3.5 - 5.2 mmol/L   Chloride 104 96 - 106 mmol/L   CO2 24 20 - 29 mmol/L   Calcium 9.9 8.6 - 10.2 mg/dL  Results for orders placed or performed in visit on 11/28/23  HM DIABETES EYE EXAM  Result Value Ref Range   HM Diabetic Eye Exam No Retinopathy No Retinopathy    Assessment & Plan    Follow-up exam, 3-6 months since previous exam Assessment & Plan: Up to date on COVID booster, pneumonia, and RSV vaccines. Hepatitis B vaccination status unclear; received one dose in 2009. Discussed completing series with HEPLISAV (two-shot) or checking availability of three-shot series at pharmacy. - Discuss completing Hepatitis B vaccination series with HEPLISAV (two-shot) or check availability of three-shot series at pharmacy   Type 2 diabetes mellitus with stage 2 chronic kidney disease, with long-term current use of insulin (HCC) Assessment & Plan: HbA1c improved to 7.6% from 7.7% over the last 90 days (based on Freestyle Libre 3 estimate). Reports occasional early morning hypoglycemia (68-69 mg/dL). Discussed adjusting insulin dosages for nocturnal hypoglycemia and postprandial hyperglycemia. Prefers reducing metformin dosage with the goal of discontinuation. - Adjust Lantus to 63 units at night - Adjust Humalog to 20 units in the morning, 22 units at lunch, and 22 units at dinner - Order HbA1c and Vitamin B12 tests - Follow up in six months with a physical exam - Send a screenshot of daily glucose patterns from [place] app in three  months  Orders: -     Hemoglobin A1c -     Lipid panel -     Vitamin B12 -     Basic metabolic panel  Prinzmetal variant angina (HCC) Assessment & Plan: Stable. Following with cardiology.  No acute concerns.  Continue to monitor.   Hypertension associated with diabetes (HCC) Assessment & Plan: Blood pressure is well-controlled. No current issues noted.  Continue lisinopril, hydrochlorothiazide and verapamil.   Mixed hyperlipidemia Assessment & Plan: Started on ezetimibe by cardiology to lower cholesterol. Goal: LDL < 70 mg/dL. - Order cholesterol test - Continue atorvastatin and ezetimibe.   High risk medication use -     Vitamin B12   Follow-up - Follow up  in six months for a physical exam - Send a screenshot of daily glucose patterns from [place] app in three months.   Return in about 6 months (around 05/27/2024) for CPE, DM.      I discussed the assessment and treatment plan with the patient  The patient was provided an opportunity to ask questions and all were answered. The patient agreed with the plan and demonstrated an understanding of the instructions.   The patient was advised to call back or seek an in-person evaluation if the symptoms worsen or if the condition fails to improve as anticipated.    Sherlyn Hay, DO  [facility] [phone] (phone) [phone] (fax)  [facility]

## 2023-11-28 NOTE — Patient Instructions (Signed)
Lantus: take 63 units  Humalog: take 20 units at breakfast, 22 at lunch and dinner

## 2023-11-29 ENCOUNTER — Telehealth: Payer: Self-pay | Admitting: Family Medicine

## 2023-11-29 LAB — BASIC METABOLIC PANEL
BUN/Creatinine Ratio: 12 (ref 10–24)
BUN: 15 mg/dL (ref 8–27)
CO2: 24 mmol/L (ref 20–29)
Calcium: 9.9 mg/dL (ref 8.6–10.2)
Chloride: 104 mmol/L (ref 96–106)
Creatinine, Ser: 1.21 mg/dL (ref 0.76–1.27)
Glucose: 123 mg/dL — ABNORMAL HIGH (ref 70–99)
Potassium: 5.4 mmol/L — ABNORMAL HIGH (ref 3.5–5.2)
Sodium: 145 mmol/L — ABNORMAL HIGH (ref 134–144)
eGFR: 63 mL/min/{1.73_m2} (ref >59)

## 2023-11-29 LAB — LIPID PANEL
Chol/HDL Ratio: 2.4 {ratio} (ref 0.0–5.0)
Cholesterol, Total: 110 mg/dL (ref 100–199)
HDL: 46 mg/dL (ref >39)
LDL Chol Calc (NIH): 45 mg/dL (ref 0–99)
Triglycerides: 100 mg/dL (ref 0–149)
VLDL Cholesterol Cal: 19 mg/dL (ref 5–40)

## 2023-11-29 LAB — VITAMIN B12: Vitamin B-12: 202 pg/mL — ABNORMAL LOW (ref 232–1245)

## 2023-11-29 LAB — HEMOGLOBIN A1C
Est. average glucose Bld gHb Est-mCnc: 214 mg/dL
Hgb A1c MFr Bld: 9.1 % — ABNORMAL HIGH (ref 4.8–5.6)

## 2023-11-29 NOTE — Telephone Encounter (Signed)
Pt called regarding his A1C results. Pt states that his [name] [name] shows an A1C of 7.7. He states he showed this to Dr. Payton Mccallum.  [facility] results are 9.1.  Pt would like test to be re-run by [facility].  Pt would like a response via MyChart.  Please advise.

## 2023-12-01 NOTE — Assessment & Plan Note (Addendum)
Stable. Following with cardiology.  No acute concerns.  Continue to monitor.

## 2023-12-01 NOTE — Assessment & Plan Note (Signed)
Up to date on COVID booster, pneumonia, and RSV vaccines. Hepatitis B vaccination status unclear; received one dose in 2009. Discussed completing series with HEPLISAV (two-shot) or checking availability of three-shot series at pharmacy. - Discuss completing Hepatitis B vaccination series with HEPLISAV (two-shot) or check availability of three-shot series at pharmacy

## 2023-12-01 NOTE — Assessment & Plan Note (Signed)
HbA1c improved to 7.6% from 7.7% over the last 90 days (based on Freestyle Libre 3 estimate). Reports occasional early morning hypoglycemia (68-69 mg/dL). Discussed adjusting insulin dosages for nocturnal hypoglycemia and postprandial hyperglycemia. Prefers reducing metformin dosage with the goal of discontinuation. - Adjust Lantus to 63 units at night - Adjust Humalog to 20 units in the morning, 22 units at lunch, and 22 units at dinner - Order HbA1c and Vitamin B12 tests - Follow up in six months with a physical exam - Send a screenshot of daily glucose patterns from [place] app in three months

## 2023-12-01 NOTE — Assessment & Plan Note (Signed)
Blood pressure is well-controlled. No current issues noted.  Continue lisinopril, hydrochlorothiazide and verapamil.

## 2023-12-01 NOTE — Assessment & Plan Note (Signed)
Started on ezetimibe by cardiology to lower cholesterol. Goal: LDL < 70 mg/dL. - Order cholesterol test - Continue atorvastatin and ezetimibe.

## 2023-12-05 ENCOUNTER — Encounter: Payer: Self-pay | Admitting: Family Medicine

## 2024-01-06 ENCOUNTER — Encounter: Payer: Self-pay | Admitting: Family Medicine

## 2024-01-06 ENCOUNTER — Ambulatory Visit (INDEPENDENT_AMBULATORY_CARE_PROVIDER_SITE_OTHER): Admitting: Family Medicine

## 2024-01-06 ENCOUNTER — Ambulatory Visit: Payer: Self-pay | Admitting: Family Medicine

## 2024-01-06 VITALS — BP 146/74 | HR 72 | Ht 71.0 in | Wt 242.0 lb

## 2024-01-06 DIAGNOSIS — Z8619 Personal history of other infectious and parasitic diseases: Secondary | ICD-10-CM | POA: Diagnosis not present

## 2024-01-06 DIAGNOSIS — R1013 Epigastric pain: Secondary | ICD-10-CM

## 2024-01-06 NOTE — Progress Notes (Signed)
 ACUTE PATIENT VISIT    Patient: [name] [name]   DOB: 1951-02-21   73 y.o. Male  MRN: [id number] Visit Date: 01/06/2024  Today's healthcare provider: Ronnald Ramp, MD   PCP: Sherlyn Hay, DO   Chief Complaint  Patient presents with   Blood Sugar Problem    Elevated blood today reading was 212   Abdominal Pain    Burning pain feels like he is bloated he has had this issue in the past and needed and endoscopy done he wants to make sure he dont need it again      Subjective     HPI     Blood Sugar Problem    Additional comments: Elevated blood today reading was 212        Abdominal Pain    Additional comments: Burning pain feels like he is bloated he has had this issue in the past and needed and endoscopy done he wants to make sure he dont need it again        Last edited by Thedora Hinders, CMA on 01/06/2024  4:08 PM.       Discussed the use of AI scribe software for clinical note transcription with the patient, who gave verbal consent to proceed.  History of Present Illness   The patient is a 74 year old with type 2 diabetes and chronic kidney disease who presents with stomach discomfort and abnormal blood sugars.  He has been experiencing burning pain and bloating in his abdomen for the past couple of weeks. He recalls a similar issue years ago when he had an infection in his stomach, which was treated with antibiotics following an endoscopy. He wonders if the H. pylori infection has returned. No fevers or chills, but he notes belching and gas. He has been on pantoprazole 40 mg daily for reflux for years.  His blood sugar was 212 earlier today, despite not having eaten lunch. He uses a Libre 3 for monitoring and notes discrepancies between its readings and his A1c, which was 9.1 last month. He takes Lantus 65 units at night and Humalog 20 units three times daily. His blood sugar sometimes drops to 80 in the mornings. He has previously stopped metformin  due to side effects but has since resumed it at 1000 mg twice daily. He is confused about why his blood sugar increases after taking Humalog.  He denies alcohol consumption. He describes his typical meals as light, often consisting of an egg sandwich or cereal for breakfast, vegetables or meat with vegetables for lunch, and sometimes a sandwich for dinner.         Past Medical History:  Diagnosis Date   Arthritis    some arthritis and djd left knee - s/p partial left knee replacement -now has pain left knee, and knee gives away   Complication of anesthesia    uvula swelling--day after surgery-could not swallow--required steroids--this was after knee surgery 2010   Coronary artery spasm (HCC)    diagnosed in the 7o's after chest pains--pt states heart cath negative--was given verapamil to take to prevent further spasms and pt states he has not had any other problems   Diabetes mellitus    GERD (gastroesophageal reflux disease)    Glaucoma    Hyperlipidemia    Hypertension associated with diabetes (HCC) 05/03/2023   Pericarditis 2012   tx'd at [facility]--with motrin 800 mg--hospitalized over night--no problems since-   Periodic edema     Medications: Outpatient Medications  Prior to Visit  Medication Sig   aspirin EC 81 MG tablet Take 81 mg by mouth daily.    atorvastatin (LIPITOR) 40 MG tablet Take 1 tablet (40 mg total) by mouth daily.   BOOSTRIX 5-2.5-18.5 LF-MCG/0.5 injection    Continuous Glucose Sensor (FREESTYLE LIBRE 3 SENSOR) MISC PLACE 1 SENSOR ON THE SKIN EVERY 14 DAYS. USE TO CHECK GLUCOSE CONTINUOUSLY   hydrochlorothiazide (HYDRODIURIL) 25 MG tablet Take 1 tablet (25 mg total) by mouth daily.   insulin glargine (LANTUS SOLOSTAR) 100 UNIT/ML Solostar Pen INJECT 65 UNITS UNDER THE SKIN ONCE DAILY   insulin lispro (HUMALOG KWIKPEN) 100 UNIT/ML KwikPen INJECT 20 UNUTS UNDER THE SKIN EVERY AM, 20 UNITS AT NOON, AND 20 UNITS WITH EVENING MEAL   latanoprost (XALATAN) 0.005 %  ophthalmic solution USE 1 DROP IN BOTH EYES AT BEDTIME   lisinopril (ZESTRIL) 40 MG tablet TAKE 40 MG BY MOUTH DAILY.   metFORMIN (GLUCOPHAGE) 1000 MG tablet TAKE 1 TABLET BY MOUTH EVERY MORNING AND 1 AT BEDTIME (Patient taking differently: No sig reported)   nitroGLYCERIN (NITROSTAT) 0.4 MG SL tablet PLACE 1 TABLET UNDER THE TONGUE EVERY 5 MINUTES AS NEEDED FOR CHEST PAIN, SEEK MEDICAL ATTENTION IF NO RELIEF AFTER 2ND DOSE   pantoprazole (PROTONIX) 40 MG tablet TAKE 1 TABLET BY MOUTH EVERY DAY   pioglitazone (ACTOS) 45 MG tablet TAKE 1 TABLET BY MOUTH EVERY MORNING   PNEUMOVAX 23 25 MCG/0.5ML injection    verapamil (CALAN-SR) 240 MG CR tablet TAKE 1 TABLET BY MOUTH EVERY DAY   ezetimibe (ZETIA) 10 MG tablet Take 1 tablet (10 mg total) by mouth daily.   No facility-administered medications prior to visit.    Review of Systems  Last CBC Lab Results  Component Value Date   WBC 9.5 12/24/2017   HGB 13.4 12/24/2017   HCT 40.4 12/24/2017   MCV 86.5 12/24/2017   MCH 28.7 12/24/2017   RDW 13.4 12/24/2017   PLT 222 12/24/2017   Last metabolic panel Lab Results  Component Value Date   GLUCOSE 123 (H) 11/28/2023   NA 145 (H) 11/28/2023   K 5.4 (H) 11/28/2023   CL 104 11/28/2023   CO2 24 11/28/2023   BUN 15 11/28/2023   CREATININE 1.21 11/28/2023   EGFR 63 11/28/2023   CALCIUM 9.9 11/28/2023   PROT 7.3 04/30/2023   ALBUMIN 4.8 04/30/2023   LABGLOB 2.5 04/30/2023   BILITOT 0.4 04/30/2023   ALKPHOS 74 04/30/2023   AST 25 04/30/2023   ALT 30 04/30/2023   ANIONGAP 11 12/24/2017   Last hemoglobin A1c Lab Results  Component Value Date   HGBA1C 9.1 (H) 11/28/2023        Objective    BP (!) 146/74   Pulse 72   Ht 5\' 11"  (1.803 m)   Wt 242 lb (109.8 kg)   SpO2 98%   BMI 33.75 kg/m   BP Readings from Last 3 Encounters:  01/06/24 (!) 146/74  11/28/23 128/60  08/01/23 128/60   Wt Readings from Last 3 Encounters:  01/06/24 242 lb (109.8 kg)  11/28/23 241 lb 12.8 oz  (109.7 kg)  08/01/23 237 lb 4 oz (107.6 kg)        Physical Exam  Physical Exam   ABDOMEN: Abdomen soft, bloated. Possible ventral hernia. Normal BS throughout       No results found for any visits on 01/06/24.  Assessment & Plan     Problem List Items Addressed This Visit   None Visit  Diagnoses       Epigastric pain    -  Primary   Relevant Orders   Comprehensive metabolic panel   Lipase   CBC   Ambulatory referral to Gastroenterology   US Abdomen Limited     History of Helicobacter pylori infection       Relevant Orders   Ambulatory referral to Gastroenterology   US Abdomen Limited           Uncontrolled Type 2 Diabetes Mellitus He has Type 2 Diabetes Mellitus with chronic kidney disease and mixed hyperlipidemia. Current A1c is 9.1, indicating poor glycemic control. Reports elevated blood glucose levels, with a reading of 212 mg/dL today, despite Lantus and Humalog regimen. Experienced hypoglycemic episodes in the morning, suggesting possible issues with insulin administration timing. Current insulin regimen may not be optimal, and adjustments to Lantus timing and dosage are considered. He expressed concerns about metformin side effects, which he had previously stopped and then restarted. - Adjust Lantus to 23 units at night and 40 units in the morning. - Monitor blood glucose levels closely after insulin regimen adjustment. - Consider referral to endocrinology if glycemic control does not improve.  Epigastric Pain Reports burning pain and bloating in the epigastric region for the past few weeks. Has H. pylori infection history treated with antibiotics and is currently on pantoprazole for reflux. Possibility of recurrent H. pylori infection, pancreatitis, or other gastrointestinal issues. Gallbladder removal rules out gallbladder-related issues. Further investigation with laboratory tests and imaging is planned. Ventral hernia may contribute to symptoms. - Order CBC to  check for white blood cell abnormalities. - Order metabolic panel to assess liver function and rule out pancreatitis. - Order abdominal ultrasound to evaluate for ventral hernia and other abnormalities. - Refer to gastroenterology for further evaluation and possible endoscopy.  Hypertension He has hypertension and is currently on lisinopril. No specific discussion of blood pressure control in this visit, but it is part of his chronic medical conditions.  Follow-up Advised to follow up for further evaluation and management of his conditions. - Follow up in 3 months for glucose monitoring. - Follow up in 6 months for general evaluation.         No follow-ups on file.         Ronnald Ramp, MD  [facility] [phone] (phone) [phone] (fax)  [facility]

## 2024-01-06 NOTE — Telephone Encounter (Signed)
 Chief Complaint: Elevated blood sugar Symptoms: Stomach burning, bloated Frequency: Intermittent Pertinent Negatives: Patient denies fever, difficulty breathing, dizziness Disposition: [] ED /[] Urgent Care (no appt availability in office) / [] Appointment(In office/virtual)/ []  [facility]/ [] Home Care/ [] Refused Recommended Disposition /[] [place] [facility]/ []  Follow-up with PCP Additional Notes: Pt states he has been having issues with his blood sugar spiking. Pt states even when he takes insulin his blood sugar goes up. It eventually comes down he states. Pt states he has been having stomach issues such as pain in the top part, burning, and bloating. Pt scheduled for an appt today with a different provider as his PCP does not have an appt until April. This RN educated pt on home care, new-worsening symptoms, when to call back/seek emergent care. Pt verbalized understanding and agrees to plan.    Copied from CRM [id number]. Topic: Clinical - Red Word Triage >> Jan 06, 2024  3:05 PM Clayton Bibles wrote: His stomach is also bothering him. It's like is burns and feels bloated.  Reason for Disposition  Blood glucose 70-240 mg/dL (3.9 -91.4 mmol/L)  Answer Assessment - Initial Assessment Questions 1. BLOOD GLUCOSE: "What is your blood glucose level?"      226 2. ONSET: "When did you check the blood glucose?"     Right now 3. USUAL RANGE: "What is your glucose level usually?" (e.g., usual fasting morning value, usual evening value)     Normally in 100s, occasionally in 200s; with Humalog goes up to 340 4. TYPE 1 or 2:  "Do you know what type of diabetes you have?"  (e.g., Type 1, Type 2, Gestational; doesn't know)      Type 2 5. INSULIN: "Do you take insulin?" "What type of insulin(s) do you use? What is the mode of delivery? (syringe, pen; injection or pump)?"      Humalog 6. DIABETES PILLS: "Do you take any pills for your diabetes?" If Yes, ask: "Have you missed taking any pills  recently?"     Yes; recently stopped metformin due to side effects and now taking it again 7. OTHER SYMPTOMS: "Do you have any symptoms?" (e.g., fever, frequent urination, difficulty breathing, dizziness, weakness, vomiting)     Denies  Protocols used: Diabetes - High Blood Sugar-A-AH

## 2024-01-06 NOTE — Telephone Encounter (Signed)
Pt has been seen in clinic today.

## 2024-01-07 ENCOUNTER — Ambulatory Visit
Admission: RE | Admit: 2024-01-07 | Discharge: 2024-01-07 | Disposition: A | Discharge status: Home / Self Care | Source: Ambulatory Visit | Attending: Family Medicine | Admitting: Family Medicine

## 2024-01-07 DIAGNOSIS — R1013 Epigastric pain: Secondary | ICD-10-CM | POA: Diagnosis present

## 2024-01-07 DIAGNOSIS — Z8619 Personal history of other infectious and parasitic diseases: Secondary | ICD-10-CM | POA: Diagnosis present

## 2024-01-07 LAB — CBC
Hematocrit: 37.5 % (ref 37.5–51.0)
Hemoglobin: 12.6 g/dL — ABNORMAL LOW (ref 13.0–17.7)
MCH: 29.9 pg (ref 26.6–33.0)
MCHC: 33.6 g/dL (ref 31.5–35.7)
MCV: 89 fL (ref 79–97)
Platelets: 248 10*3/uL (ref 150–450)
RBC: 4.22 x10E6/uL (ref 4.14–5.80)
RDW: 12.6 % (ref 11.6–15.4)
WBC: 8.3 10*3/uL (ref 3.4–10.8)

## 2024-01-07 LAB — LIPASE: Lipase: 236 U/L — ABNORMAL HIGH (ref 13–78)

## 2024-01-07 LAB — COMPREHENSIVE METABOLIC PANEL
ALT: 27 IU/L (ref 0–44)
AST: 18 IU/L (ref 0–40)
Albumin: 4.2 g/dL (ref 3.8–4.8)
Alkaline Phosphatase: 70 IU/L (ref 44–121)
BUN/Creatinine Ratio: 15 (ref 10–24)
BUN: 20 mg/dL (ref 8–27)
Bilirubin Total: 0.4 mg/dL (ref 0.0–1.2)
CO2: 24 mmol/L (ref 20–29)
Calcium: 9.3 mg/dL (ref 8.6–10.2)
Chloride: 103 mmol/L (ref 96–106)
Creatinine, Ser: 1.32 mg/dL — ABNORMAL HIGH (ref 0.76–1.27)
Globulin, Total: 2.6 g/dL (ref 1.5–4.5)
Glucose: 233 mg/dL — ABNORMAL HIGH (ref 70–99)
Potassium: 4.9 mmol/L (ref 3.5–5.2)
Sodium: 141 mmol/L (ref 134–144)
Total Protein: 6.8 g/dL (ref 6.0–8.5)
eGFR: 57 mL/min/{1.73_m2} — ABNORMAL LOW (ref >59)

## 2024-01-08 ENCOUNTER — Encounter: Payer: Self-pay | Admitting: Family Medicine

## 2024-01-08 ENCOUNTER — Telehealth: Payer: Self-pay

## 2024-01-08 NOTE — Telephone Encounter (Signed)
 Copied from CRM [id number]. Topic: Referral - Request for Referral >> Jan 08, 2024  8:51 AM Carlatta H wrote: Did the patient discuss referral with their provider in the last year? Yes (If No - schedule appointment) (If Yes - send message)  Appointment offered? No  Type of order/referral and detailed reason for visit: Stomach Pain  Preference of office, provider, location: Gastroenterology in [place]  If referral order, have you been seen by this specialty before? Yes (If Yes, this issue or another issue? When? Where? [place]  Can we respond through MyChart? No

## 2024-01-08 NOTE — Telephone Encounter (Signed)
 FYI-message has been sent to provider from message he sent regarding referral to GI    Copied from CRM [id number]. Topic: Clinical - Medical Advice >> Jan 08, 2024  8:55 AM Carlatta H wrote: Reason for CRM: patient would like the nurse to give him a call regarding stomach issues

## 2024-01-09 ENCOUNTER — Encounter: Payer: Self-pay | Admitting: Family Medicine

## 2024-01-09 ENCOUNTER — Telehealth: Payer: Self-pay

## 2024-01-09 ENCOUNTER — Other Ambulatory Visit: Payer: Self-pay | Admitting: Family Medicine

## 2024-01-09 DIAGNOSIS — R1013 Epigastric pain: Secondary | ICD-10-CM

## 2024-01-09 DIAGNOSIS — Z8619 Personal history of other infectious and parasitic diseases: Secondary | ICD-10-CM

## 2024-01-09 MED ORDER — ONDANSETRON 4 MG PO TBDP: 4.0000 mg | ORAL_TABLET | Freq: Three times a day (TID) | ORAL | 0 refills | Status: DC | PRN

## 2024-01-09 NOTE — Addendum Note (Signed)
 Addended by: Bing Neighbors on: 01/09/2024 07:39 AM   Modules accepted: Orders

## 2024-01-09 NOTE — Telephone Encounter (Signed)
 Copied from CRM [id number]. Topic: Clinical - Lab/Test Results >> Jan 09, 2024 11:14 AM Patsy Lager T wrote: Reason for CRM: patient called stated he spoke with Oneita Jolly who advised him that provider would need to state the referral was "Urgent" otherwise they could not see him until July. Please f/u with patient

## 2024-01-13 ENCOUNTER — Other Ambulatory Visit: Payer: Self-pay | Admitting: Family Medicine

## 2024-01-14 ENCOUNTER — Encounter: Payer: Self-pay | Admitting: *Deleted

## 2024-01-14 ENCOUNTER — Ambulatory Visit

## 2024-01-14 ENCOUNTER — Ambulatory Visit
Admission: RE | Admit: 2024-01-14 | Discharge: 2024-01-14 | Disposition: A | Discharge status: Home / Self Care | Attending: Gastroenterology | Admitting: Gastroenterology

## 2024-01-14 ENCOUNTER — Encounter
Admission: RE | Disposition: A | Discharge status: Home / Self Care | Payer: Self-pay | Source: Home / Self Care | Attending: Gastroenterology

## 2024-01-14 DIAGNOSIS — K219 Gastro-esophageal reflux disease without esophagitis: Secondary | ICD-10-CM | POA: Diagnosis not present

## 2024-01-14 DIAGNOSIS — E119 Type 2 diabetes mellitus without complications: Secondary | ICD-10-CM | POA: Diagnosis not present

## 2024-01-14 DIAGNOSIS — K2970 Gastritis, unspecified, without bleeding: Secondary | ICD-10-CM | POA: Diagnosis not present

## 2024-01-14 DIAGNOSIS — I2510 Atherosclerotic heart disease of native coronary artery without angina pectoris: Secondary | ICD-10-CM | POA: Insufficient documentation

## 2024-01-14 DIAGNOSIS — Z794 Long term (current) use of insulin: Secondary | ICD-10-CM | POA: Insufficient documentation

## 2024-01-14 DIAGNOSIS — K317 Polyp of stomach and duodenum: Secondary | ICD-10-CM | POA: Diagnosis not present

## 2024-01-14 DIAGNOSIS — I10 Essential (primary) hypertension: Secondary | ICD-10-CM | POA: Diagnosis not present

## 2024-01-14 DIAGNOSIS — G709 Myoneural disorder, unspecified: Secondary | ICD-10-CM | POA: Insufficient documentation

## 2024-01-14 DIAGNOSIS — Z7984 Long term (current) use of oral hypoglycemic drugs: Secondary | ICD-10-CM | POA: Diagnosis not present

## 2024-01-14 DIAGNOSIS — R1013 Epigastric pain: Secondary | ICD-10-CM | POA: Diagnosis present

## 2024-01-14 LAB — GLUCOSE, CAPILLARY: Glucose-Capillary: 187 mg/dL — ABNORMAL HIGH (ref 70–99)

## 2024-01-14 SURGERY — EGD (ESOPHAGOGASTRODUODENOSCOPY)
Anesthesia: General

## 2024-01-14 MED ORDER — PHENYLEPHRINE 80 MCG/ML (10ML) SYRINGE FOR IV PUSH (FOR BLOOD PRESSURE SUPPORT)
PREFILLED_SYRINGE | INTRAVENOUS | Status: DC | PRN
  Administered 2024-01-14: 160 ug via INTRAVENOUS

## 2024-01-14 MED ORDER — LIDOCAINE HCL (PF) 2 % IJ SOLN
INTRAMUSCULAR | Status: DC | PRN
  Administered 2024-01-14: 100 mg via INTRADERMAL

## 2024-01-14 MED ORDER — PROPOFOL 500 MG/50ML IV EMUL
INTRAVENOUS | Status: DC | PRN
  Administered 2024-01-14: 120 mg via INTRAVENOUS
  Administered 2024-01-14: 200 ug/kg/min via INTRAVENOUS

## 2024-01-14 MED ORDER — SODIUM CHLORIDE 0.9 % IV SOLN: INTRAVENOUS | Status: DC

## 2024-01-14 NOTE — Anesthesia Preprocedure Evaluation (Signed)
 Anesthesia Evaluation  Patient identified by MRN, date of birth, ID band Patient awake    Reviewed: Allergy & Precautions, NPO status , Patient's Chart, lab work & pertinent test results  History of Anesthesia Complications Negative for: history of anesthetic complications  Airway Mallampati: III  TM Distance: <3 FB Neck ROM: full    Dental  (+) Chipped, Poor Dentition, Missing   Pulmonary neg pulmonary ROS, neg shortness of breath   Pulmonary exam normal        Cardiovascular Exercise Tolerance: Good hypertension, + angina  + CAD  Normal cardiovascular exam     Neuro/Psych  Neuromuscular disease  negative psych ROS   GI/Hepatic Neg liver ROS,GERD  Controlled,,  Endo/Other  diabetes, Type 2    Renal/GU Renal disease  negative genitourinary   Musculoskeletal   Abdominal   Peds  Hematology negative hematology ROS (+)   Anesthesia Other Findings Past Medical History: No date: Arthritis     Comment:  some arthritis and djd left knee - s/p partial left knee              replacement -now has pain left knee, and knee gives away No date: Complication of anesthesia     Comment:  uvula swelling--day after surgery-could not               swallow--required steroids--this was after knee surgery               2010 No date: Coronary artery spasm (HCC)     Comment:  diagnosed in the 7o's after chest pains--pt states heart              cath negative--was given verapamil to take to prevent               further spasms and pt states he has not had any other               problems No date: Diabetes mellitus No date: GERD (gastroesophageal reflux disease) No date: Glaucoma No date: Hyperlipidemia 05/03/2023: Hypertension associated with diabetes (HCC) 2012: Pericarditis     Comment:  tx'd at [facility]--with motrin 800 mg--hospitalized over               night--no problems since- No date: Periodic edema  Past Surgical  History: 06/30/1979: ANTERIOR FUSION CERVICAL SPINE No date: CHOLECYSTECTOMY 06/16/2012: FOREIGN BODY REMOVAL     Comment:  Procedure: REMOVAL FOREIGN BODY EXTREMITY;  Surgeon:               Shelda Pal, MD;  Location: [facility];  Service:               Orthopedics;  Laterality: Left; No date: JOINT REPLACEMENT 10/29/2008: left partial knee replacement     Comment:  surgery was at [facility] in [place], [place] 10/30/2011: nasal septal recontruction No date: SPINE SURGERY 06/16/2012: TOTAL KNEE REVISION     Comment:  Procedure: TOTAL KNEE REVISION;  Surgeon: Shelda Pal, MD;  Location: [facility];  Service: Orthopedics;                Laterality: Left;  Revision Left Partial Knee Femoral               Component  BMI    Body Mass Index: 33.47 kg/m      Reproductive/Obstetrics negative OB ROS  Anesthesia Physical Anesthesia Plan  ASA: 3  Anesthesia Plan: General   Post-op Pain Management:    Induction: Intravenous  PONV Risk Score and Plan: Propofol infusion and TIVA  Airway Management Planned: Natural Airway and Nasal Cannula  Additional Equipment:   Intra-op Plan:   Post-operative Plan:   Informed Consent: I have reviewed the patients History and Physical, chart, labs and discussed the procedure including the risks, benefits and alternatives for the proposed anesthesia with the patient or authorized representative who has indicated his/her understanding and acceptance.     Dental Advisory Given  Plan Discussed with: Anesthesiologist, CRNA and Surgeon  Anesthesia Plan Comments: (Patient consented for risks of anesthesia including but not limited to:  - adverse reactions to medications - risk of airway placement if required - damage to eyes, teeth, lips or other oral mucosa - nerve damage due to positioning  - sore throat or hoarseness - Damage to heart, brain, nerves, lungs, other parts  of body or loss of life  Patient voiced understanding and assent.)       Anesthesia Quick Evaluation

## 2024-01-14 NOTE — Interval H&P Note (Signed)
 History and Physical Interval Note:  01/14/2024 9:50 AM  [name] [name]  has presented today for surgery, with the diagnosis of ABDOMINAL PAIN DYSPEPSIA.  The various methods of treatment have been discussed with the patient and family. After consideration of risks, benefits and other options for treatment, the patient has consented to  Procedure(s): EGD (ESOPHAGOGASTRODUODENOSCOPY) (N/A) as a surgical intervention.  The patient's history has been reviewed, patient examined, no change in status, stable for surgery.  I have reviewed the patient's chart and labs.  Questions were answered to the patient's satisfaction.     Regis Bill  Ok to proceed with EGD

## 2024-01-14 NOTE — Anesthesia Postprocedure Evaluation (Signed)
 Anesthesia Post Note  Patient: [name] [name]  Procedure(s) Performed: EGD (ESOPHAGOGASTRODUODENOSCOPY) POLYPECTOMY  Patient location during evaluation: Endoscopy Anesthesia Type: General Level of consciousness: awake and alert Pain management: pain level controlled Vital Signs Assessment: post-procedure vital signs reviewed and stable Respiratory status: spontaneous breathing, nonlabored ventilation, respiratory function stable and patient connected to nasal cannula oxygen Cardiovascular status: blood pressure returned to baseline and stable Postop Assessment: no apparent nausea or vomiting Anesthetic complications: no   There were no known notable events for this encounter.   Last Vitals:  Vitals:   01/14/24 0858 01/14/24 1006  BP: (!) 153/63 (!) 96/38  Pulse: 72   Resp: 18   Temp: (!) 36.1 C (!) 36.1 C  SpO2: 100%     Last Pain:  Vitals:   01/14/24 1026  TempSrc:   PainSc: 0-No pain                 Cleda Mccreedy Reah Justo

## 2024-01-14 NOTE — H&P (Signed)
 Outpatient short stay form Pre-procedure 01/14/2024  [name] Bill, MD  Primary Physician: Sherlyn Hay, DO  Reason for visit:  Dyspepsia  History of present illness:    73 y/o gentleman with history of obesity, DM II, h pylori, and hypertension here for EGD for dyspeptic symptoms. No blood thinners. No family history of GI malignancies. History of neck surgery and cholecystectomy.    Current Facility-Administered Medications:    0.9 %  sodium chloride infusion, , Intravenous, Continuous, Nicie Milan, Rossie Muskrat, MD, Last Rate: 20 mL/hr at 01/14/24 0919, New Bag at 01/14/24 0919  Medications Prior to Admission  Medication Sig Dispense Refill Last Dose/Taking   aspirin EC 81 MG tablet Take 81 mg by mouth daily.    01/13/2024   atorvastatin (LIPITOR) 40 MG tablet Take 1 tablet (40 mg total) by mouth daily. 90 tablet 3 01/13/2024   ezetimibe (ZETIA) 10 MG tablet Take 1 tablet (10 mg total) by mouth daily. 90 tablet 3 01/13/2024   insulin glargine (LANTUS SOLOSTAR) 100 UNIT/ML Solostar Pen INJECT 65 UNITS UNDER THE SKIN ONCE DAILY 30 mL 5 01/13/2024   insulin lispro (HUMALOG KWIKPEN) 100 UNIT/ML KwikPen INJECT 20 UNUTS UNDER THE SKIN EVERY AM, 20 UNITS AT NOON, AND 20 UNITS WITH EVENING MEAL 30 mL 4 01/13/2024   latanoprost (XALATAN) 0.005 % ophthalmic solution USE 1 DROP IN BOTH EYES AT BEDTIME  1 01/13/2024   lisinopril (ZESTRIL) 40 MG tablet TAKE 40 MG BY MOUTH DAILY. 90 tablet 0 01/13/2024   metFORMIN (GLUCOPHAGE) 1000 MG tablet TAKE 1 TABLET BY MOUTH EVERY MORNING AND 1 AT BEDTIME (Patient taking differently: No sig reported) 180 tablet 1 01/13/2024   pantoprazole (PROTONIX) 40 MG tablet TAKE 1 TABLET BY MOUTH EVERY DAY 90 tablet 3 01/13/2024   pioglitazone (ACTOS) 45 MG tablet TAKE 1 TABLET BY MOUTH EVERY MORNING 90 tablet 3 01/13/2024   verapamil (CALAN-SR) 240 MG CR tablet TAKE 1 TABLET BY MOUTH EVERY DAY 90 tablet 3 01/14/2024 Morning   BOOSTRIX 5-2.5-18.5 LF-MCG/0.5 injection        Continuous Glucose Sensor (FREESTYLE LIBRE 3 SENSOR) MISC PLACE 1 SENSOR ON THE SKIN EVERY 14 DAYS. USE TO CHECK GLUCOSE CONTINUOUSLY 2 each 3    hydrochlorothiazide (HYDRODIURIL) 25 MG tablet Take 1 tablet (25 mg total) by mouth daily. 90 tablet 3    nitroGLYCERIN (NITROSTAT) 0.4 MG SL tablet PLACE 1 TABLET UNDER THE TONGUE EVERY 5 MINUTES AS NEEDED FOR CHEST PAIN, SEEK MEDICAL ATTENTION IF NO RELIEF AFTER 2ND DOSE 250 tablet 3    ondansetron (ZOFRAN-ODT) 4 MG disintegrating tablet Take 1 tablet (4 mg total) by mouth every 8 (eight) hours as needed for nausea or vomiting. 20 tablet 0    PNEUMOVAX 23 25 MCG/0.5ML injection         Allergies  Allergen Reactions   Azithromycin Hives and Itching    IS LISTED AS DRUG ALLERGY ON PROGRESS NOTES FROM [facility], vibromycin, IS LISTED AS DRUG ALLERGY ON PROGRESS NOTES FROM [facility], vibromycin    IS LISTED AS DRUG ALLERGY ON PROGRESS NOTES FROM [facility] vibromycin IS LISTED AS DRUG ALLERGY ON PROGRESS NOTES FROM [facility] vibromycin    IS LISTED AS DRUG ALLERGY ON PROGRESS NOTES FROM [facility] vibromycin   Iodinated Contrast Media Other (See Comments)    Pt only gets Hot   Other Rash   Doxycycline Calcium Itching and Rash   Vancomycin Itching and Rash  Past Medical History:  Diagnosis Date   Arthritis    some arthritis and djd left knee - s/p partial left knee replacement -now has pain left knee, and knee gives away   Complication of anesthesia    uvula swelling--day after surgery-could not swallow--required steroids--this was after knee surgery 2010   Coronary artery spasm (HCC)    diagnosed in the 7o's after chest pains--pt states heart cath negative--was given verapamil to take to prevent further spasms and pt states he has not had any other problems   Diabetes mellitus    GERD (gastroesophageal reflux disease)    Glaucoma    Hyperlipidemia    Hypertension associated with diabetes  (HCC) 05/03/2023   Pericarditis 2012   tx'd at [facility]--with motrin 800 mg--hospitalized over night--no problems since-   Periodic edema     Review of systems:  Otherwise negative.    Physical Exam  Gen: Alert, oriented. Appears stated age.  HEENT: PERRLA. Lungs: No respiratory distress CV: RRR Abd: soft, benign, no masses Ext: No edema    Planned procedures: Proceed with EGD. The patient understands the nature of the planned procedure, indications, risks, alternatives and potential complications including but not limited to bleeding, infection, perforation, damage to internal organs and possible oversedation/side effects from anesthesia. The patient agrees and gives consent to proceed.  Please refer to procedure notes for findings, recommendations and patient disposition/instructions.     [name] Bill, MD [facility]

## 2024-01-14 NOTE — Op Note (Signed)
 [facility] Patient Name: [name] [name] Procedure Date: 01/14/2024 9:42 AM MRN: [id number] Account #: [id number] Date of Birth: November 19, 1950 Admit Type: Outpatient Age: 74 Room: [facility] ENDO ROOM 1 Gender: Male Note Status: Finalized Instrument Name: Upper Endoscope [id number] Procedure:             Upper GI endoscopy Indications:           Dyspepsia Providers:             Eather Colas MD, MD Referring MD:          Sherlyn Hay (Referring MD) Medicines:             Monitored Anesthesia Care Complications:         No immediate complications. Estimated blood loss:                         Minimal. Procedure:             Pre-Anesthesia Assessment:                        - Prior to the procedure, a History and Physical was                         performed, and patient medications and allergies were                         reviewed. The patient is competent. The risks and                         benefits of the procedure and the sedation options and                         risks were discussed with the patient. All questions                         were answered and informed consent was obtained.                         Patient identification and proposed procedure were                         verified by the physician, the nurse, the                         anesthesiologist, the anesthetist and the technician                         in the endoscopy suite. Mental Status Examination:                         alert and oriented. Airway Examination: normal                         oropharyngeal airway and neck mobility. Respiratory                         Examination: clear to auscultation. CV Examination:  normal. Prophylactic Antibiotics: The patient does not                         require prophylactic antibiotics. Prior                         Anticoagulants: The patient has taken no anticoagulant                         or  antiplatelet agents. ASA Grade Assessment: III - A                         patient with severe systemic disease. After reviewing                         the risks and benefits, the patient was deemed in                         satisfactory condition to undergo the procedure. The                         anesthesia plan was to use monitored anesthesia care                         (MAC). Immediately prior to administration of                         medications, the patient was re-assessed for adequacy                         to receive sedatives. The heart rate, respiratory                         rate, oxygen saturations, blood pressure, adequacy of                         pulmonary ventilation, and response to care were                         monitored throughout the procedure. The physical                         status of the patient was re-assessed after the                         procedure.                        After obtaining informed consent, the endoscope was                         passed under direct vision. Throughout the procedure,                         the patient's blood pressure, pulse, and oxygen                         saturations were monitored continuously. The Endoscope  was introduced through the mouth, and advanced to the                         second part of duodenum. The upper GI endoscopy was                         accomplished without difficulty. The patient tolerated                         the procedure well. Findings:      The examined esophagus was normal.      Diffuse mild inflammation characterized by erythema was found in the       gastric antrum. Biopsies were taken with a cold forceps for Helicobacter       pylori testing. Estimated blood loss was minimal.      A single 4 mm sessile polyp with no bleeding and no stigmata of recent       bleeding was found on the greater curvature of the gastric antrum. The       polyp was  removed with a cold snare. Resection and retrieval were       complete. Estimated blood loss was minimal.      The examined duodenum was normal. Impression:            - Normal esophagus.                        - Gastritis. Biopsied.                        - A single gastric polyp. Resected and retrieved.                        - Normal examined duodenum. Recommendation:        - Discharge patient to home.                        - Resume previous diet.                        - Continue present medications.                        - Await pathology results.                        - Return to referring physician as previously                         scheduled. Procedure Code(s):     --- Professional ---                        [phone], Esophagogastroduodenoscopy, flexible,                         transoral; with removal of tumor(s), polyp(s), or                         other lesion(s) by snare technique Diagnosis Code(s):     --- Professional ---  K29.70, Gastritis, unspecified, without bleeding                        K31.7, Polyp of stomach and duodenum                        R10.13, Epigastric pain CPT copyright 2022 [employer]. All rights reserved. The codes documented in this report are preliminary and upon coder review may  be revised to meet current compliance requirements. Eather Colas MD, MD 01/14/2024 10:13:00 AM Number of Addenda: 0 Note Initiated On: 01/14/2024 9:42 AM Estimated Blood Loss:  Estimated blood loss was minimal.      [facility]

## 2024-01-14 NOTE — Telephone Encounter (Signed)
 Requested Prescriptions  Pending Prescriptions Disp Refills   lisinopril (ZESTRIL) 40 MG tablet [Pharmacy Med Name: LISINOPRIL 40 MG TABLET] 90 tablet 0    Sig: TAKE 40 MG BY MOUTH DAILY.     Cardiovascular:  ACE Inhibitors Failed - 01/14/2024 11:53 AM      Failed - Cr in normal range and within 180 days    Creatinine  Date Value Ref Range Status  12/22/2013 0.98 0.60 - 1.30 mg/dL Final   Creatinine, Ser  Date Value Ref Range Status  01/06/2024 1.32 (H) 0.76 - 1.27 mg/dL Final         Failed - Last BP in normal range    BP Readings from Last 1 Encounters:  01/14/24 (!) 96/38         Passed - K in normal range and within 180 days    Potassium  Date Value Ref Range Status  01/06/2024 4.9 3.5 - 5.2 mmol/L Final  12/22/2013 4.4 3.5 - 5.1 mmol/L Final         Passed - Patient is not pregnant      Passed - Valid encounter within last 6 months    Recent Outpatient Visits           1 month ago Follow-up exam, 3-6 months since previous exam   [facility] [place], Sarah N, DO   7 months ago Hypertension associated with diabetes [facility])   [facility] [place], Monico Blitz, DO   8 months ago Annual physical exam   [facility] [place], Monico Blitz, DO       Future Appointments             In 2 weeks Dunn, Raymon Mutton, PA-C [facility] [place]   In 4 months [place], Monico Blitz, DO [facility], [facility]

## 2024-01-14 NOTE — Transfer of Care (Signed)
 Immediate Anesthesia Transfer of Care Note  Patient: [name] [name]  Procedure(s) Performed: EGD (ESOPHAGOGASTRODUODENOSCOPY) POLYPECTOMY  Patient Location: PACU  Anesthesia Type:General  Level of Consciousness: drowsy  Airway & Oxygen Therapy: Patient Spontanous Breathing  Post-op Assessment: Report given to RN and Post -op Vital signs reviewed and stable  Post vital signs: Reviewed and stable  Last Vitals:  Vitals Value Taken Time  BP 96/38 01/14/24 1006  Temp 36.1 C 01/14/24 1006  Pulse 60 01/14/24 1007  Resp 20 01/14/24 1007  SpO2 93 % 01/14/24 1007  Vitals shown include unfiled device data.  Last Pain:  Vitals:   01/14/24 1006  TempSrc: Temporal  PainSc: Asleep         Complications: There were no known notable events for this encounter.

## 2024-01-15 LAB — SURGICAL PATHOLOGY

## 2024-01-27 NOTE — Progress Notes (Unsigned)
 Cardiology Office Note    Date:  01/30/2024   ID:  [name] [name], DOB 12/03/1950, MRN [id number]  PCP:  Sherlyn Hay, DO  Cardiologist:  Lorine Bears, MD  Electrophysiologist:  None   Chief Complaint: Follow-up  History of Present Illness:   [name] [name] is a 74 y.o. male with history of coronary artery disease, diabetes, GERD, hyperlipidemia, hypertension, and Prinzmetal angina who presented for follow-up on Prinzmetal angina.   Patient previously followed with [facility] and was last seen there in 2015. At that time he was seen for chest pain and shortness of breath. Stress echo was unremarkable. He had a cardiac catheterization x 2 with no obstructive coronary artery disease but he was suspected of having coronary artery spasm and diagnosed with Prinzmetal angina. This was managed with verapamil. Previously seen by Dr. Rennis Golden in 2018 and reported some chest discomfort at that time. He underwent nuclear stress testing which showed no evidence of ischemia and normal ejection fraction. He was seen by and establish care with Dr. Kirke Corin 07/2023 at which time he was doing well from a cardiac perspective with minimal chest pain. Also endorsed stable exertional dyspnea and chronic lower extremity edema. No further testing or medication changes were indicated at that time.  Today, patient reports that he is overall doing well from a cardiac perspective without symptoms of exertional angina or cardiac decompensation. He endorses occasional chest pain which he tells me is baseline for him and he attributes to his diagnosis of Prinzmetal angina. He also reports chronic lower extremity edema which he takes hydrochlorothiazide for. He takes his blood pressure regularly at home and reports that on average his systolic pressure is 115-125 mmHg with occasional readings up to 140 mmHg. He denies shortness of breath, lightheadedness, dizziness, palpitations, bleeding, orthopnea, and PND.   Labs independently  reviewed: 12/2023 - Hgb 12.6, PLT 248, BUN 20, serum creatinine 1.32, potassium 4.9, albumin 4.2, AST/ALT normal 10/2023 - TC 110, TG 100, HDL 46, LDL 45, A1c 9.1  Past Medical History:  Diagnosis Date   Arthritis    some arthritis and djd left knee - s/p partial left knee replacement -now has pain left knee, and knee gives away   Complication of anesthesia    uvula swelling--day after surgery-could not swallow--required steroids--this was after knee surgery 2010   Coronary artery spasm (HCC)    diagnosed in the 7o's after chest pains--pt states heart cath negative--was given verapamil to take to prevent further spasms and pt states he has not had any other problems   Diabetes mellitus    GERD (gastroesophageal reflux disease)    Glaucoma    Hyperlipidemia    Hypertension associated with diabetes (HCC) 05/03/2023   Pericarditis 2012   tx'd at [facility]--with motrin 800 mg--hospitalized over night--no problems since-   Periodic edema     Past Surgical History:  Procedure Laterality Date   ANTERIOR FUSION CERVICAL SPINE  06/30/1979   CHOLECYSTECTOMY     ESOPHAGOGASTRODUODENOSCOPY N/A 01/14/2024   Procedure: EGD (ESOPHAGOGASTRODUODENOSCOPY);  Surgeon: Regis Bill, MD;  Location: [facility] ENDOSCOPY;  Service: Endoscopy;  Laterality: N/A;   FOREIGN BODY REMOVAL  06/16/2012   Procedure: REMOVAL FOREIGN BODY EXTREMITY;  Surgeon: Shelda Pal, MD;  Location: [facility] ORS;  Service: Orthopedics;  Laterality: Left;   JOINT REPLACEMENT     left partial knee replacement  10/29/2008   surgery was at [facility] in [place]   nasal septal recontruction  10/30/2011  POLYPECTOMY  01/14/2024   Procedure: POLYPECTOMY;  Surgeon: Regis Bill, MD;  Location: [facility] ENDOSCOPY;  Service: Endoscopy;;   SPINE SURGERY     TOTAL KNEE REVISION  06/16/2012   Procedure: TOTAL KNEE REVISION;  Surgeon: Shelda Pal, MD;  Location: [facility] ORS;  Service: Orthopedics;  Laterality: Left;  Revision Left  Partial Knee Femoral Component    Current Medications: Current Meds  Medication Sig   acetaminophen (TYLENOL) 325 MG tablet Take 325 mg by mouth every 6 (six) hours as needed.   aspirin EC 81 MG tablet Take 81 mg by mouth daily.    atorvastatin (LIPITOR) 40 MG tablet Take 1 tablet (40 mg total) by mouth daily.   BOOSTRIX 5-2.5-18.5 LF-MCG/0.5 injection    Continuous Glucose Sensor (FREESTYLE LIBRE 3 SENSOR) MISC PLACE 1 SENSOR ON THE SKIN EVERY 14 DAYS. USE TO CHECK GLUCOSE CONTINUOUSLY   empagliflozin (JARDIANCE) 10 MG TABS tablet Take 1 tablet (10 mg total) by mouth daily before breakfast.   ezetimibe (ZETIA) 10 MG tablet Take 1 tablet (10 mg total) by mouth daily.   hydrochlorothiazide (HYDRODIURIL) 25 MG tablet Take 1 tablet (25 mg total) by mouth daily.   insulin glargine (LANTUS SOLOSTAR) 100 UNIT/ML Solostar Pen INJECT 65 UNITS UNDER THE SKIN ONCE DAILY   insulin lispro (HUMALOG KWIKPEN) 100 UNIT/ML KwikPen INJECT 20 UNUTS UNDER THE SKIN EVERY AM, 20 UNITS AT NOON, AND 20 UNITS WITH EVENING MEAL   ketoconazole (NIZORAL) 2 % cream Apply 1 Application topically daily. For 6 weeks, or until resolved   latanoprost (XALATAN) 0.005 % ophthalmic solution USE 1 DROP IN BOTH EYES AT BEDTIME   lisinopril (ZESTRIL) 40 MG tablet TAKE 40 MG BY MOUTH DAILY.   metFORMIN (GLUCOPHAGE) 1000 MG tablet TAKE 1 TABLET BY MOUTH EVERY MORNING AND 1 AT BEDTIME (Patient taking differently: No sig reported)   nitroGLYCERIN (NITROSTAT) 0.4 MG SL tablet PLACE 1 TABLET UNDER THE TONGUE EVERY 5 MINUTES AS NEEDED FOR CHEST PAIN, SEEK MEDICAL ATTENTION IF NO RELIEF AFTER 2ND DOSE   ondansetron (ZOFRAN-ODT) 4 MG disintegrating tablet Take 1 tablet (4 mg total) by mouth every 8 (eight) hours as needed for nausea or vomiting.   pantoprazole (PROTONIX) 40 MG tablet TAKE 1 TABLET BY MOUTH EVERY DAY   pioglitazone (ACTOS) 45 MG tablet TAKE 1 TABLET BY MOUTH EVERY MORNING   PNEUMOVAX 23 25 MCG/0.5ML injection    verapamil  (CALAN-SR) 240 MG CR tablet TAKE 1 TABLET BY MOUTH EVERY DAY    Allergies:   Azithromycin, Iodinated contrast media, Other, Doxycycline calcium, and Vancomycin   Social History   Socioeconomic History   Marital status: Married    Spouse name: Not on file   Number of children: 0   Years of education: Some college   Highest education level: Some college, no degree  Occupational History   Occupation: Retired  Tobacco Use   Smoking status: Never   Smokeless tobacco: Never  Vaping Use   Vaping status: Never Used  Substance and Sexual Activity   Alcohol use: No   Drug use: No   Sexual activity: Not on file  Other Topics Concern   Not on file  Social History Narrative   Lives with Redell Nazir, partner.    Caffeine use: none   Drinks diet sprite, ginger ale daily   Right handed   Social Drivers of Health   Financial Resource Strain: Low Risk  (01/10/2024)   Received [facility]  Overall Financial Resource Strain (CARDIA)    Difficulty of Paying Living Expenses: Not hard at all  Food Insecurity: No Food Insecurity (01/10/2024)   Received from [facility]   Hunger Vital Sign    Worried About Running Out of Food in the Last Year: Never true    Ran Out of Food in the Last Year: Never true  Transportation Needs: No Transportation Needs (01/10/2024)   Received from [facility] - Transportation    In the past 12 months, has lack of transportation kept you from medical appointments or from getting medications?: No    Lack of Transportation (Non-Medical): No  Physical Activity: Insufficiently Active (06/18/2023)   Exercise Vital Sign    Days of Exercise per Week: 4 days    Minutes of Exercise per Session: 30 min  Stress: No Stress Concern Present (06/18/2023)   [employer] of Occupational Health - Occupational Stress Questionnaire    Feeling of Stress : Not at all  Social Connections: Socially Integrated  (06/18/2023)   Social Connection and Isolation Panel [NHANES]    Frequency of Communication with Friends and Family: More than three times a week    Frequency of Social Gatherings with Friends and Family: More than three times a week    Attends Religious Services: More than 4 times per year    Active Member of [employer] or Organizations: Yes    Attends Engineer, structural: More than 4 times per year    Marital Status: Married     Family History:  The patient's family history includes Coronary artery disease in his father; Deep vein thrombosis in his maternal grandmother; Diabetes in his mother; Diabetes Mellitus II in his mother; Heart attack in his mother; Heart disease in his father and mother; Hypertension in his father and mother; Kidney disease in his father and mother; Vision loss in his mother.  ROS:   12-point review of systems is negative unless otherwise noted in the HPI.   EKGs/Labs/Other Studies Reviewed:    Studies reviewed were summarized above. The additional studies were reviewed today:  Treadmill MPI 03/27/2017: The left ventricular ejection fraction is normal (55-65%). Nuclear stress EF: 55%. Blood pressure demonstrated a hypertensive response to exercise. No T wave inversion was noted during stress. There was no ST segment deviation noted during stress. This is a low risk study.   Normal perfusion. LVEF 55% with normal wall motion. Hypertensive response to exercise. This is a low risk study.   EKG:  EKG is ordered today.  The EKG ordered today demonstrates normal sinus rhythm  Recent Labs: 01/06/2024: ALT 27; BUN 20; Creatinine, Ser 1.32; Hemoglobin 12.6; Platelets 248; Potassium 4.9; Sodium 141  Recent Lipid Panel    Component Value Date/Time   CHOL 110 11/28/2023 1124   TRIG 100 11/28/2023 1124   HDL 46 11/28/2023 1124   CHOLHDL 2.4 11/28/2023 1124   LDLCALC 45 11/28/2023 1124    PHYSICAL EXAM:    VS:  BP 130/64 (BP Location: Left Arm, Patient  Position: Sitting, Cuff Size: Large)   Pulse 75   Resp 17   Ht 5\' 11"  (1.803 m)   Wt 241 lb (109.3 kg)   SpO2 94%   BMI 33.61 kg/m   BMI: Body mass index is 33.61 kg/m.  Physical Exam Constitutional:      Appearance: Normal appearance.  Neck:     Vascular: No carotid bruit.  Cardiovascular:     Rate and  Rhythm: Normal rate and regular rhythm.     Pulses: Normal pulses.     Heart sounds: Normal heart sounds. No murmur heard. Pulmonary:     Effort: Pulmonary effort is normal.     Breath sounds: Normal breath sounds. No wheezing or rales.  Abdominal:     General: Abdomen is flat. There is no distension.     Palpations: Abdomen is soft.  Musculoskeletal:        General: No deformity.     Comments: Trace LE edema bilaterally  Skin:    General: Skin is warm and dry.  Neurological:     General: No focal deficit present.     Mental Status: He is alert and oriented to person, place, and time. Mental status is at baseline.  Psychiatric:        Mood and Affect: Mood normal.        Behavior: Behavior normal.    Wt Readings from Last 3 Encounters:  01/30/24 241 lb (109.3 kg)  01/28/24 241 lb (109.3 kg)  01/14/24 240 lb (108.9 kg)    ASSESSMENT & PLAN:   Coronary artery disease with Prinzmetal angina -No symptoms of exertional angina or cardiac decompensation. Stable on verapamil once daily. No indication for further ischemic evaluation at this time. Continue aspirin 81 mg daily and verapamil 240 mg daily.   Hyperlipidemia - Most recent lipid panel 10/2023 with LDL 45, at goal. Continue current dose of atorvastatin and ezetimibe.   Essential hypertension - Blood pressure today slightly above goal at 130/64.  The patient reports that home readings are well-controlled.  Continue current dose of lisinopril and verapamil.  Diabetes mellitus - Most recent A1C 9.1, follows with PCP and is considering establishing with endocrinology  Disposition: F/u with Dr. Kirke Corin or an APP in 1  year or sooner as needed.   Medication Adjustments/Labs and Tests Ordered: Current medicines are reviewed at length with the patient today.  Concerns regarding medicines are outlined above. Medication changes, Labs and Tests ordered today are summarized above and listed in the Patient Instructions accessible in Encounters.   Velora Mediate, PA-C 01/30/2024 10:24 AM     [facility] [place] [address] [place], [place] [address] [phone]

## 2024-01-28 ENCOUNTER — Encounter: Payer: Self-pay | Admitting: Family Medicine

## 2024-01-28 ENCOUNTER — Ambulatory Visit (INDEPENDENT_AMBULATORY_CARE_PROVIDER_SITE_OTHER): Admitting: Family Medicine

## 2024-01-28 VITALS — BP 149/74 | HR 67 | Temp 97.5°F | Ht 71.0 in | Wt 241.0 lb

## 2024-01-28 DIAGNOSIS — Z794 Long term (current) use of insulin: Secondary | ICD-10-CM

## 2024-01-28 DIAGNOSIS — E1122 Type 2 diabetes mellitus with diabetic chronic kidney disease: Secondary | ICD-10-CM | POA: Diagnosis not present

## 2024-01-28 DIAGNOSIS — N182 Chronic kidney disease, stage 2 (mild): Secondary | ICD-10-CM | POA: Diagnosis not present

## 2024-01-28 DIAGNOSIS — B353 Tinea pedis: Secondary | ICD-10-CM | POA: Diagnosis not present

## 2024-01-28 MED ORDER — EMPAGLIFLOZIN 10 MG PO TABS: 10.0000 mg | ORAL_TABLET | Freq: Every day | ORAL | 3 refills | Status: DC

## 2024-01-28 MED ORDER — KETOCONAZOLE 2 % EX CREA: 1.0000 | TOPICAL_CREAM | Freq: Every day | CUTANEOUS | 1 refills | Status: DC

## 2024-01-28 NOTE — Progress Notes (Signed)
 Established patient visit   Patient: [name] [name]   DOB: [birth year]   73 y.o. Male  MRN: [id number] Visit Date: 01/28/2024  Today's healthcare provider: Sherlyn Hay, DO   Chief Complaint  Patient presents with   Diabetes    Patient is here today to consult about possible referral to Endocrinology.  He reports that his glucose runs from 90-370.  He also reports having high glucose after he takes his insulin.  Patient may not being consistent with when he eats, when he takes his insulin or what he eats.  He reports today that he has not eaten anything since 6 pm last night and his glucose currently is 154.  He also reports that sometimes he will take his insulin after he eats.  He had stopped his Metformin for about 2 weeks   Subjective    Diabetes   [name] [name] is a 73 year old male with diabetes who presents with concerns about blood sugar control.  He has been experiencing difficulty managing his blood sugar levels despite taking a set dose of insulin. His blood sugar levels have been higher than usual, particularly postprandially. This morning, his fasting blood sugar was 154 mg/dL. He notes that his blood sugar tends to peak after lunch, which he sometimes eats as late as 1 or 2 PM. He has stopped taking metformin for the past two weeks due to previous concerns about the medication.  He currently takes 65 units of insulin at night and uses 20 units of Humalog 3 times a day for mealtime dosing. His insulin administration varies, as he sometimes takes it before eating and sometimes after. He has not been using a sliding scale or counting carbohydrates in his meals. He acknowledges that his dietary choices, such as eating sausage, biscuits, and gravy, may contribute to his elevated blood sugar levels.  He has a history of stomach issues, including irritation and the removal of a polyp via endoscopy.   No numbness, tingling, or ulcers in his feet. He regularly checks his feet for  wounds and applies lotion to them.     Medications: Outpatient Medications Prior to Visit  Medication Sig   aspirin EC 81 MG tablet Take 81 mg by mouth daily.    atorvastatin (LIPITOR) 40 MG tablet Take 1 tablet (40 mg total) by mouth daily.   BOOSTRIX 5-2.5-18.5 LF-MCG/0.5 injection    Continuous Glucose Sensor (FREESTYLE LIBRE 3 SENSOR) MISC PLACE 1 SENSOR ON THE SKIN EVERY 14 DAYS. USE TO CHECK GLUCOSE CONTINUOUSLY   hydrochlorothiazide (HYDRODIURIL) 25 MG tablet Take 1 tablet (25 mg total) by mouth daily.   insulin glargine (LANTUS SOLOSTAR) 100 UNIT/ML Solostar Pen INJECT 65 UNITS UNDER THE SKIN ONCE DAILY   insulin lispro (HUMALOG KWIKPEN) 100 UNIT/ML KwikPen INJECT 20 UNUTS UNDER THE SKIN EVERY AM, 20 UNITS AT NOON, AND 20 UNITS WITH EVENING MEAL   latanoprost (XALATAN) 0.005 % ophthalmic solution USE 1 DROP IN BOTH EYES AT BEDTIME   lisinopril (ZESTRIL) 40 MG tablet TAKE 40 MG BY MOUTH DAILY.   metFORMIN (GLUCOPHAGE) 1000 MG tablet TAKE 1 TABLET BY MOUTH EVERY MORNING AND 1 AT BEDTIME (Patient taking differently: No sig reported)   nitroGLYCERIN (NITROSTAT) 0.4 MG SL tablet PLACE 1 TABLET UNDER THE TONGUE EVERY 5 MINUTES AS NEEDED FOR CHEST PAIN, SEEK MEDICAL ATTENTION IF NO RELIEF AFTER 2ND DOSE   ondansetron (ZOFRAN-ODT) 4 MG disintegrating tablet Take 1 tablet (4 mg total) by mouth every 8 (eight)  hours as needed for nausea or vomiting.   pantoprazole (PROTONIX) 40 MG tablet TAKE 1 TABLET BY MOUTH EVERY DAY   pioglitazone (ACTOS) 45 MG tablet TAKE 1 TABLET BY MOUTH EVERY MORNING   PNEUMOVAX 23 25 MCG/0.5ML injection    verapamil (CALAN-SR) 240 MG CR tablet TAKE 1 TABLET BY MOUTH EVERY DAY   ezetimibe (ZETIA) 10 MG tablet Take 1 tablet (10 mg total) by mouth daily.   No facility-administered medications prior to visit.        Objective    BP (!) 149/74 (BP Location: Right Arm, Patient Position: Sitting, Cuff Size: Normal)   Pulse 67   Temp (!) 97.5 F (36.4 C) (Oral)    Ht 5\' 11"  (1.803 m)   Wt 241 lb (109.3 kg)   SpO2 97%   BMI 33.61 kg/m     Physical Exam Constitutional:      Appearance: Normal appearance.  HENT:     Head: Normocephalic and atraumatic.  Eyes:     General: No scleral icterus.    Conjunctiva/sclera: Conjunctivae normal.  Cardiovascular:     Pulses:          Dorsalis pedis pulses are 2+ on the right side and 2+ on the left side.       Posterior tibial pulses are 2+ on the right side and 2+ on the left side.  Musculoskeletal:     Right foot: Normal range of motion. No deformity, bunion, Charcot foot, foot drop or prominent metatarsal heads.     Left foot: Normal range of motion. No deformity, bunion, Charcot foot, foot drop or prominent metatarsal heads.       Feet:  Feet:     Right foot:     Protective Sensation: 10 sites tested.  9 sites sensed.     Skin integrity: Skin breakdown (between toes (4th and 5th)), callus and dry skin present. No ulcer, blister, erythema, warmth or fissure.     Toenail Condition: Right toenails are abnormally thick, long and ingrown.     Left foot:     Protective Sensation: 10 sites tested.  9 sites sensed.     Skin integrity: Skin breakdown (between toes (3rd and 4th, plus 4th and 5th)), callus and dry skin present. No ulcer, blister, erythema, warmth or fissure.     Toenail Condition: Left toenails are abnormally thick, long and ingrown.  Neurological:     Mental Status: He is alert and oriented to person, place, and time. Mental status is at baseline.  Psychiatric:        Mood and Affect: Mood normal.        Behavior: Behavior normal.      No results found for any visits on 01/28/24.  Assessment & Plan    Type 2 diabetes mellitus with stage 2 chronic kidney disease, with long-term current use of insulin (HCC) -     Ambulatory referral to Podiatry -     Empagliflozin; Take 1 tablet (10 mg total) by mouth daily before breakfast.  Dispense: 30 tablet; Refill: 3  Tinea pedis of both feet -      Ketoconazole; Apply 1 Application topically daily. For 6 weeks, or until resolved  Dispense: 60 g; Refill: 1 -     Ambulatory referral to Podiatry   Type 2 Diabetes Mellitus with stage 2 chronic kidney disease, with long-term use of insulin Experiencing difficulty in controlling blood glucose levels due to inconsistent insulin dosing and dietary choices. Patient chose to discontinue  metformin for a few weeks, contributing to poor control. Reports postprandial hyperglycemia after Humalog administration, likely due to improper timing, as well as using constant, rather than sliding scale, dosing. Does not practice carbohydrate counting. Discussed benefits of sliding scale and potential for insulin pump use (the latter through endocrinology). Explained rapid-acting insulin medications function much more slowly than endogenous insulin peaking at 6 minutes. Emphasized dietary impact, recommending complex carbohydrates for stable glucose levels. - Start Jardiance 10 mg daily - Implement a low dose sliding scale for insulin as provided in the after visit summary, after being on Jardiance for a couple weeks - Consider endocrinology referral if desired; pt to send message if he would like to pursue - Educate on timing of insulin administration relative to meals  Tinea pedis Presence of skin breakdown on feet, necessitating intervention to prevent complications. Conducted foot exam and emphasized importance of wound monitoring and foot care. Recommended podiatry follow-up. - Prescribe a cream for skin breakdown on feet - Refer to podiatry for further management and follow-up   Follow-up Instructed to monitor response to new medication and insulin regimen. Provided follow-up communication instructions and potential endocrinology referral. - Monitor blood glucose levels and response to new medication and insulin regimen - Use MyChart for communication regarding endocrinology referral   Return in about  5 weeks (around 03/03/2024) for DM (recheck sliding scale).      I discussed the assessment and treatment plan with the patient  The patient was provided an opportunity to ask questions and all were answered. The patient agreed with the plan and demonstrated an understanding of the instructions.   The patient was advised to call back or seek an in-person evaluation if the symptoms worsen or if the condition fails to improve as anticipated.    Sherlyn Hay, DO  [facility] [phone] (phone) [phone] (fax)  [facility]

## 2024-01-28 NOTE — Patient Instructions (Addendum)
  DO NOT START UNTIL you see how the jardiance affects you. For blood glucose:  150-199, give an additional 1 unit 202-249, give an additional 2 units 250-299, give an additional 3 units 300-349, give an additional 4 units >349, give an additional 5 units Please contact the clinic if you are consistently giving yourself the extra 5 units.

## 2024-01-30 ENCOUNTER — Encounter: Payer: Self-pay | Admitting: Physician Assistant

## 2024-01-30 ENCOUNTER — Ambulatory Visit: Payer: PRIVATE HEALTH INSURANCE | Attending: Physician Assistant | Admitting: Physician Assistant

## 2024-01-30 VITALS — BP 130/64 | HR 75 | Resp 17 | Ht 71.0 in | Wt 241.0 lb

## 2024-01-30 DIAGNOSIS — I201 Angina pectoris with documented spasm: Secondary | ICD-10-CM | POA: Diagnosis not present

## 2024-01-30 DIAGNOSIS — E785 Hyperlipidemia, unspecified: Secondary | ICD-10-CM | POA: Diagnosis not present

## 2024-01-30 DIAGNOSIS — I152 Hypertension secondary to endocrine disorders: Secondary | ICD-10-CM

## 2024-01-30 DIAGNOSIS — E1159 Type 2 diabetes mellitus with other circulatory complications: Secondary | ICD-10-CM

## 2024-01-30 NOTE — Patient Instructions (Signed)
 Medication Instructions:  Your Physician recommend you continue on your current medication as directed.    *If you need a refill on your cardiac medications before your next appointment, please call your pharmacy*  Lab Work: None ordered at this time   Follow-Up: At [facility], you and your health needs are our priority.  As part of our continuing mission to provide you with exceptional heart care, our providers are all part of one team.  This team includes your primary Cardiologist (physician) and Advanced Practice Providers or APPs (Physician Assistants and Nurse Practitioners) who all work together to provide you with the care you need, when you need it.  Your next appointment:   1 year(s)  Provider:   You may see Lorine Bears, MD or Eula Listen, PA-C

## 2024-02-05 ENCOUNTER — Ambulatory Visit: Payer: PRIVATE HEALTH INSURANCE | Admitting: Podiatry

## 2024-02-05 ENCOUNTER — Encounter: Payer: Self-pay | Admitting: Podiatry

## 2024-02-05 DIAGNOSIS — B351 Tinea unguium: Secondary | ICD-10-CM | POA: Diagnosis not present

## 2024-02-05 DIAGNOSIS — M79676 Pain in unspecified toe(s): Secondary | ICD-10-CM

## 2024-02-05 DIAGNOSIS — E1142 Type 2 diabetes mellitus with diabetic polyneuropathy: Secondary | ICD-10-CM | POA: Diagnosis not present

## 2024-02-05 NOTE — Progress Notes (Signed)
 Subjective:  Patient ID: [name] [name], male    DOB: 1951-04-26,  MRN: [id number] HPI Chief Complaint  Patient presents with   Diabetes    Diabetic foot exam - last A1c was 9.1, meds are currently being regulated, requesting toenail trim, PCP rx'd cream he feels has now caused the rash around and between the toes   New Patient (Initial Visit)    Est pt 2019    73 y.o. male presents with the above complaint.   ROS: Denies fever chills nausea vomit muscle aches pains calf pain back pain chest pain shortness of breath.  Past Medical History:  Diagnosis Date   Arthritis    some arthritis and djd left knee - s/p partial left knee replacement -now has pain left knee, and knee gives away   Complication of anesthesia    uvula swelling--day after surgery-could not swallow--required steroids--this was after knee surgery 2010   Coronary artery spasm (HCC)    diagnosed in the 7o's after chest pains--pt states heart cath negative--was given verapamil to take to prevent further spasms and pt states he has not had any other problems   Diabetes mellitus    GERD (gastroesophageal reflux disease)    Glaucoma    Hyperlipidemia    Hypertension associated with diabetes (HCC) 05/03/2023   Pericarditis 2012   tx'd at [facility]--with motrin 800 mg--hospitalized over night--no problems since-   Periodic edema    Past Surgical History:  Procedure Laterality Date   ANTERIOR FUSION CERVICAL SPINE  06/30/1979   CHOLECYSTECTOMY     ESOPHAGOGASTRODUODENOSCOPY N/A 01/14/2024   Procedure: EGD (ESOPHAGOGASTRODUODENOSCOPY);  Surgeon: Regis Bill, MD;  Location: [facility] ENDOSCOPY;  Service: Endoscopy;  Laterality: N/A;   FOREIGN BODY REMOVAL  06/16/2012   Procedure: REMOVAL FOREIGN BODY EXTREMITY;  Surgeon: Shelda Pal, MD;  Location: [facility] ORS;  Service: Orthopedics;  Laterality: Left;   JOINT REPLACEMENT     left partial knee replacement  10/29/2008   surgery was at [facility] in [place], [place]   nasal  septal recontruction  10/30/2011   POLYPECTOMY  01/14/2024   Procedure: POLYPECTOMY;  Surgeon: Regis Bill, MD;  Location: [facility] ENDOSCOPY;  Service: Endoscopy;;   SPINE SURGERY     TOTAL KNEE REVISION  06/16/2012   Procedure: TOTAL KNEE REVISION;  Surgeon: Shelda Pal, MD;  Location: [facility] ORS;  Service: Orthopedics;  Laterality: Left;  Revision Left Partial Knee Femoral Component    Current Outpatient Medications:    acetaminophen (TYLENOL) 325 MG tablet, Take 325 mg by mouth every 6 (six) hours as needed., Disp: , Rfl:    aspirin EC 81 MG tablet, Take 81 mg by mouth daily. , Disp: , Rfl:    atorvastatin (LIPITOR) 40 MG tablet, Take 1 tablet (40 mg total) by mouth daily., Disp: 90 tablet, Rfl: 3   BOOSTRIX 5-2.5-18.5 LF-MCG/0.5 injection, , Disp: , Rfl:    Continuous Glucose Sensor (FREESTYLE LIBRE 3 SENSOR) MISC, PLACE 1 SENSOR ON THE SKIN EVERY 14 DAYS. USE TO CHECK GLUCOSE CONTINUOUSLY, Disp: 2 each, Rfl: 3   empagliflozin (JARDIANCE) 10 MG TABS tablet, Take 1 tablet (10 mg total) by mouth daily before breakfast., Disp: 30 tablet, Rfl: 3   ezetimibe (ZETIA) 10 MG tablet, Take 1 tablet (10 mg total) by mouth daily., Disp: 90 tablet, Rfl: 3   hydrochlorothiazide (HYDRODIURIL) 25 MG tablet, Take 1 tablet (25 mg total) by mouth daily., Disp: 90 tablet, Rfl: 3   insulin glargine (LANTUS SOLOSTAR) 100 UNIT/ML  Solostar Pen, INJECT 65 UNITS UNDER THE SKIN ONCE DAILY, Disp: 30 mL, Rfl: 5   insulin lispro (HUMALOG KWIKPEN) 100 UNIT/ML KwikPen, INJECT 20 UNUTS UNDER THE SKIN EVERY AM, 20 UNITS AT NOON, AND 20 UNITS WITH EVENING MEAL, Disp: 30 mL, Rfl: 4   latanoprost (XALATAN) 0.005 % ophthalmic solution, USE 1 DROP IN BOTH EYES AT BEDTIME, Disp: , Rfl: 1   lisinopril (ZESTRIL) 40 MG tablet, TAKE 40 MG BY MOUTH DAILY., Disp: 90 tablet, Rfl: 0   metFORMIN (GLUCOPHAGE) 1000 MG tablet, TAKE 1 TABLET BY MOUTH EVERY MORNING AND 1 AT BEDTIME (Patient taking differently: No sig reported), Disp: 180  tablet, Rfl: 1   nitroGLYCERIN (NITROSTAT) 0.4 MG SL tablet, PLACE 1 TABLET UNDER THE TONGUE EVERY 5 MINUTES AS NEEDED FOR CHEST PAIN, SEEK MEDICAL ATTENTION IF NO RELIEF AFTER 2ND DOSE, Disp: 250 tablet, Rfl: 3   ondansetron (ZOFRAN-ODT) 4 MG disintegrating tablet, Take 1 tablet (4 mg total) by mouth every 8 (eight) hours as needed for nausea or vomiting., Disp: 20 tablet, Rfl: 0   pantoprazole (PROTONIX) 40 MG tablet, TAKE 1 TABLET BY MOUTH EVERY DAY, Disp: 90 tablet, Rfl: 3   pioglitazone (ACTOS) 45 MG tablet, TAKE 1 TABLET BY MOUTH EVERY MORNING, Disp: 90 tablet, Rfl: 3   PNEUMOVAX 23 25 MCG/0.5ML injection, , Disp: , Rfl:    verapamil (CALAN-SR) 240 MG CR tablet, TAKE 1 TABLET BY MOUTH EVERY DAY, Disp: 90 tablet, Rfl: 3  Allergies  Allergen Reactions   Azithromycin Hives and Itching    IS LISTED AS DRUG ALLERGY ON PROGRESS NOTES FROM [facility], vibromycin, IS LISTED AS DRUG ALLERGY ON PROGRESS NOTES FROM [facility], vibromycin    IS LISTED AS DRUG ALLERGY ON PROGRESS NOTES FROM [facility] vibromycin IS LISTED AS DRUG ALLERGY ON PROGRESS NOTES FROM [facility] vibromycin    IS LISTED AS DRUG ALLERGY ON PROGRESS NOTES FROM [facility] vibromycin   Iodinated Contrast Media Other (See Comments)    Pt only gets Hot   Other Rash   Doxycycline Calcium Itching and Rash   Ketoconazole Rash   Vancomycin Itching and Rash   Review of Systems Objective:  There were no vitals filed for this visit.  General: Well developed, nourished, in no acute distress, alert and oriented x3   Dermatological: Skin is warm, dry and supple bilateral. Nails x 10 long thick yellow dystrophic clinically mycotic with onycholysis.  Remaining integument appears unremarkable at this time. There are no open sores, no preulcerative lesions, no rash or signs of infection present.  Does demonstrate xerotic skin but primarily to the plantar heels.  He also had a rash from  ketoconazole use that was prescribed by his primary care provider interdigitally.  It does look like he still retains some not only erythema from the reaction but also some tinea.  Vascular: Dorsalis Pedis artery and Posterior Tibial artery pedal pulses are 2/4 bilateral with immedate capillary fill time. Pedal hair growth present. No varicosities and no lower extremity edema present bilateral.   Neruologic: Grossly intact via light touch bilateral. Vibratory intact via tuning fork bilateral. Protective threshold with Semmes Wienstein monofilament intact to all pedal sites bilateral. Patellar and Achilles deep tendon reflexes 2+ bilateral. No Babinski or clonus noted bilateral.   Musculoskeletal: No gross boney pedal deformities bilateral. No pain, crepitus, or limitation noted with foot and ankle range of motion bilateral. Muscular strength 5/5 in all groups tested bilateral.  Gait:  Unassisted, Nonantalgic.    Radiographs:  None taken  Assessment & Plan:   Assessment: Diabetes mellitus without diabetic complications.  Interdigital tinea pedis with allergic reaction to ketoconazole.  In pain limb secondary to onychomycosis.  Dry xerotic skin.  Plan: Recommended O'Keefe's and over-the-counter Lamisil/terbinafine cream.  Debrided toenails 1 through 5 bilateral.     [name] [name], [place]

## 2024-02-14 ENCOUNTER — Other Ambulatory Visit: Payer: Self-pay | Admitting: Family Medicine

## 2024-02-14 DIAGNOSIS — E1122 Type 2 diabetes mellitus with diabetic chronic kidney disease: Secondary | ICD-10-CM

## 2024-02-14 NOTE — Telephone Encounter (Signed)
 Requested Prescriptions  Pending Prescriptions Disp Refills   Continuous Glucose Sensor (FREESTYLE LIBRE 3 SENSOR) MISC [Pharmacy Med Name: FREESTYLE LIBRE 3 SENSOR] 6 each 1    Sig: PLACE 1 SENSOR ON THE SKIN EVERY 14 DAYS. USE TO CHECK GLUCOSE CONTINUOUSLY     Endocrinology: Diabetes - Testing Supplies Failed - 02/14/2024  5:45 PM      Failed - Valid encounter within last 12 months    Recent Outpatient Visits           2 weeks ago Type 2 diabetes mellitus with stage 2 chronic kidney disease, with long-term current use of insulin  [facility])   [place] [facility] Pardue, Asencion Blacksmith, DO   1 month ago Epigastric pain   [place] [facility] Simmons-Robinson, Judyann Number, MD       Future Appointments             In 3 months Pardue, Asencion Blacksmith, DO [place] [facility]

## 2024-02-24 ENCOUNTER — Ambulatory Visit: Payer: Self-pay

## 2024-02-24 NOTE — Telephone Encounter (Signed)
 Copied from CRM [id number]. Topic: Clinical - Medical Advice >> Feb 24, 2024  3:16 PM Tiffany S wrote: Reason for CRM: Patient says his blood pressure been running low diastolic 54-58 since Friday  dizziness when standing feeling weak please follow up with patient  Chief Complaint: Low BP Symptoms: Weakness/dizziness when standing up Frequency: Since last Thursday Pertinent Negatives: Patient denies N/A Disposition: [] ED /[] Urgent Care (no appt availability in office) / [x] Appointment(In office/virtual)/ []  [facility]/ [] Home Care/ [] Refused Recommended Disposition /[] [facility]/ []  Follow-up with PCP Additional Notes: Patient called in to report low BP. Patient stated he first noticed a drop in BP following surgery for trigger finger last week. Patient stated low BP is accompanied by occasional weakness/dizziness when standing up. Patient stated symptoms resolve when he sits back down/rests. Patient's most recent BP was 136/54. Patient stated the lowest reading he has seen is 113/58. Patient would like to see a provider for this. Advised patient to be seen within 3 days, per protocol. No availability with PCP. Scheduled with alternate provider in office. Provided care advice and instructed patient to call back if symptoms worsen. Patient complied.   Reason for Disposition  Brief (now gone) dizziness or lightheadedness after standing up or eating  Answer Assessment - Initial Assessment Questions 1. BLOOD PRESSURE: "What is the blood pressure?" "Did you take at least two measurements 5 minutes apart?"     136/54 about an hour ago, 113/58 this morning, states lowest reading was 111/58 2. ONSET: "When did you take your blood pressure?"     Trigger finger surgery on Thursday, put on Bactrim and BP lowered 3. HOW: "How did you obtain the blood pressure?" (e.g., visiting nurse, automatic home BP monitor)     Automatic BP cough  4. HISTORY: "Do you have a history of low blood  pressure?" "What is your blood pressure normally?"     HTN 5. MEDICINES: "Are you taking any medications for blood pressure?" If Yes, ask: "Have they been changed recently?"     Lisinopril , verapamil   7. OTHER SYMPTOMS: "Have you been sick recently?" "Have you had a recent injury?"     States he feels weak or dizzy when standing, states these symptoms resolve when he sits back down, denies dizziness at this current time Nausea and vomiting x1 since last weekend, states stomach feels queasy at this time  Protocols used: Blood Pressure - Low-A-AH

## 2024-02-25 ENCOUNTER — Encounter: Payer: Self-pay | Admitting: Family Medicine

## 2024-02-25 ENCOUNTER — Ambulatory Visit (INDEPENDENT_AMBULATORY_CARE_PROVIDER_SITE_OTHER): Admitting: Family Medicine

## 2024-02-25 VITALS — BP 120/51 | HR 73 | Ht 71.0 in | Wt 228.0 lb

## 2024-02-25 DIAGNOSIS — I952 Hypotension due to drugs: Secondary | ICD-10-CM | POA: Diagnosis not present

## 2024-02-25 NOTE — Patient Instructions (Signed)
       VISIT SUMMARY: Today, we discussed your recent episodes of low blood pressure and dizziness. We reviewed your current medications and recent changes, including stopping Bactrim and hydrochlorothiazide . We also talked about your concerns regarding your blood pressure and overall health.  YOUR PLAN: -HYPOTENSION: Hypotension means low blood pressure, which can cause dizziness and weakness. We believe your recent hand surgery, Bactrim use, and current medications might be contributing to this. We recommend you continue to hold hydrochlorothiazide  for a few more days, reduce your lisinopril  dose to 20 mg daily, monitor your blood pressure at home, increase your water intake to six 8-ounce cups daily, and add an electrolyte mix to your water if needed. Follow up with Doctor Thaddeus Filippo on Tuesday.  -HYPERTENSION: Hypertension means high blood pressure, which needs to be managed carefully, especially with your recent low blood pressure episodes. We have adjusted your medications by holding hydrochlorothiazide  and reducing lisinopril  to manage your hypotension while still protecting your kidneys and heart. We will consider adjusting your verapamil  dose if your low blood pressure continues, but we need to be cautious due to your history of angina and advice from your cardiologist.   INSTRUCTIONS: Please follow up with Doctor Thaddeus Filippo on Tuesday. Monitor your blood pressure at home and keep a record of your readings. Increase your water intake to six 8-ounce cups daily and add an electrolyte mix to your water if needed. If you experience any new or worsening symptoms, contact our office immediately.                      Contains text generated by Abridge.                                 Contains text generated by Abridge.

## 2024-02-25 NOTE — Progress Notes (Signed)
 ACUTE PATIENT VISIT    Patient: [name] [name]   DOB: [birth year]   73 y.o. Male  MRN: [id number] Visit Date: 02/25/2024  Today's healthcare provider: Mimi Alt, MD   PCP: Carlean Charter, DO   Chief Complaint  Patient presents with   Blood Pressure Check    Low bp reading after his hand surgery 5 days ago he has stop taking the medication on Sunday due to low Bp     Subjective     HPI     Blood Pressure Check    Additional comments: Low bp reading after his hand surgery 5 days ago he has stop taking the medication on Sunday due to low Bp       Last edited by Bart Lieu, CMA on 02/25/2024  9:40 AM.       Discussed the use of AI scribe software for clinical note transcription with the patient, who gave verbal consent to proceed.  History of Present Illness          Discussed the use of AI scribe software for clinical note transcription with the patient, who gave verbal consent to proceed.  History of Present Illness [name] [name] is a 73 year old male with hypertension, diabetes, hyperlipidemia, and chronic kidney disease who presents with concerns for hypotension.  He experiences dizziness upon standing, which he associates with low blood pressure readings. His blood pressure at home has been lower than usual, with a recent reading of 120/51 mmHg and diastolic numbers as low as 47 mmHg.  He had a hand procedure five days ago and was prescribed Bactrim. He stopped taking Bactrim on Sunday due to concerns about its potential effect on his blood pressure. He also stopped taking hydrochlorothiazide  two days ago, suspecting it might be contributing to his low blood pressure.  His current medications include hydrochlorothiazide  25 mg daily, lisinopril  40 mg daily, metformin  500 mg once daily, Actos  45 mg daily, and verapamil  240 mg daily. He recently started Jardiance  and wonders if it might affect his blood pressure.  He feels weak and dizzy at times,  especially when getting up. No diarrhea since starting the antibiotic. He drinks about three to four cups of water daily and has felt nauseated, which he attributes to the antibiotic, but feels better since pausing it.       Past Medical History:  Diagnosis Date   Arthritis    some arthritis and djd left knee - s/p partial left knee replacement -now has pain left knee, and knee gives away   Complication of anesthesia    uvula swelling--day after surgery-could not swallow--required steroids--this was after knee surgery 2010   Coronary artery spasm (HCC)    diagnosed in the 7o's after chest pains--pt states heart cath negative--was given verapamil  to take to prevent further spasms and pt states he has not had any other problems   Diabetes mellitus    GERD (gastroesophageal reflux disease)    Glaucoma    Hyperlipidemia    Hypertension associated with diabetes (HCC) 05/03/2023   Pericarditis 2012   tx'd at [facility]--with motrin 800 mg--hospitalized over night--no problems since-   Periodic edema     Medications: Outpatient Medications Prior to Visit  Medication Sig   acetaminophen  (TYLENOL ) 325 MG tablet Take 325 mg by mouth every 6 (six) hours as needed.   aspirin  EC 81 MG tablet Take 81 mg by mouth daily.    atorvastatin  (LIPITOR) 40 MG tablet Take 1 tablet (  40 mg total) by mouth daily.   BOOSTRIX 5-2.5-18.5 LF-MCG/0.5 injection    Continuous Glucose Sensor (FREESTYLE LIBRE 3 SENSOR) MISC PLACE 1 SENSOR ON THE SKIN EVERY 14 DAYS. USE TO CHECK GLUCOSE CONTINUOUSLY   empagliflozin  (JARDIANCE ) 10 MG TABS tablet Take 1 tablet (10 mg total) by mouth daily before breakfast.   hydrochlorothiazide  (HYDRODIURIL ) 25 MG tablet Take 1 tablet (25 mg total) by mouth daily.   insulin  glargine (LANTUS SOLOSTAR) 100 UNIT/ML Solostar Pen INJECT 65 UNITS UNDER THE SKIN ONCE DAILY   insulin  lispro (HUMALOG KWIKPEN) 100 UNIT/ML KwikPen INJECT 20 UNUTS UNDER THE SKIN EVERY AM, 20 UNITS AT NOON, AND 20 UNITS  WITH EVENING MEAL   latanoprost (XALATAN) 0.005 % ophthalmic solution USE 1 DROP IN BOTH EYES AT BEDTIME   lisinopril  (ZESTRIL ) 40 MG tablet TAKE 40 MG BY MOUTH DAILY.   metFORMIN  (GLUCOPHAGE ) 1000 MG tablet TAKE 1 TABLET BY MOUTH EVERY MORNING AND 1 AT BEDTIME (Patient taking differently: No sig reported)   nitroGLYCERIN  (NITROSTAT ) 0.4 MG SL tablet PLACE 1 TABLET UNDER THE TONGUE EVERY 5 MINUTES AS NEEDED FOR CHEST PAIN, SEEK MEDICAL ATTENTION IF NO RELIEF AFTER 2ND DOSE   ondansetron  (ZOFRAN -ODT) 4 MG disintegrating tablet Take 1 tablet (4 mg total) by mouth every 8 (eight) hours as needed for nausea or vomiting.   pantoprazole  (PROTONIX ) 40 MG tablet TAKE 1 TABLET BY MOUTH EVERY DAY   pioglitazone  (ACTOS ) 45 MG tablet TAKE 1 TABLET BY MOUTH EVERY MORNING   PNEUMOVAX 23 25 MCG/0.5ML injection    verapamil  (CALAN -SR) 240 MG CR tablet TAKE 1 TABLET BY MOUTH EVERY DAY   ezetimibe  (ZETIA ) 10 MG tablet Take 1 tablet (10 mg total) by mouth daily.   No facility-administered medications prior to visit.    Review of Systems  Last metabolic panel Lab Results  Component Value Date   GLUCOSE 233 (H) 01/06/2024   NA 141 01/06/2024   K 4.9 01/06/2024   CL 103 01/06/2024   CO2 24 01/06/2024   BUN 20 01/06/2024   CREATININE 1.32 (H) 01/06/2024   EGFR 57 (L) 01/06/2024   CALCIUM  9.3 01/06/2024   PROT 6.8 01/06/2024   ALBUMIN 4.2 01/06/2024   LABGLOB 2.6 01/06/2024   BILITOT 0.4 01/06/2024   ALKPHOS 70 01/06/2024   AST 18 01/06/2024   ALT 27 01/06/2024   ANIONGAP 11 12/24/2017   Last hemoglobin A1c Lab Results  Component Value Date   HGBA1C 9.1 (H) 11/28/2023        Objective    BP (!) 120/51   Pulse 73   Ht 5\' 11"  (1.803 m)   Wt 228 lb (103.4 kg)   SpO2 100%   BMI 31.80 kg/m  BP Readings from Last 3 Encounters:  02/25/24 (!) 120/51  01/30/24 130/64  01/28/24 (!) 149/74   Wt Readings from Last 3 Encounters:  02/25/24 228 lb (103.4 kg)  01/30/24 241 lb (109.3 kg)   01/28/24 241 lb (109.3 kg)        Physical Exam  General: Alert, no acute distress Cardio: Normal S1 and S2, RRR, no r/m/g Pulm: CTAB, normal work of breathing ABD: soft, abdomen is not distended, there is no tenderness to palpation, normal BS  Extremities: no LE edema, right palm with palmar bandage from surgical site    No results found for any visits on 02/25/24.  Assessment & Plan     Problem List Items Addressed This Visit   None Visit Diagnoses  Hypotension due to drugs    -  Primary        Assessment & Plan Hypotension Recent onset of hypotension with dizziness and low diastolic blood pressure. Potential contributing factors include recent hand surgery, Bactrim use, and current antihypertensive regimen. Hydrochlorothiazide  already held, lisinopril  dose reduction planned. Verapamil  dose to be considered if hypotension persists, but concerns about angina and previous cardiologist advice to remain on verapamil  for life. - Hold hydrochlorothiazide  for a few more days. - Reduce lisinopril  to 20 mg daily. - Monitor blood pressure at home. - Increase water intake to six 8-ounce cups daily. - Add electrolyte mix to water if needed. - Follow up with Doctor Thaddeus Filippo on Tuesday.  Hypertension Hypertension management complicated by hypotension. Current medications include hydrochlorothiazide , lisinopril , and verapamil . Hydrochlorothiazide  held for two days. Lisinopril  reduction considered to manage hypotension while maintaining kidney and heart protection. Verapamil  dose reduction considered if hypotension persists, but concerns about angina and previous cardiologist advice to remain on verapamil  for life.         Return in about 1 week (around 03/03/2024) for blood pressure follow up.         Mimi Alt, MD  [facility] [phone] (phone) [phone] (fax)  [facility]

## 2024-03-03 ENCOUNTER — Ambulatory Visit: Admitting: Family Medicine

## 2024-03-03 ENCOUNTER — Encounter: Payer: Self-pay | Admitting: Family Medicine

## 2024-03-03 VITALS — BP 128/73 | HR 72 | Temp 97.7°F | Ht 71.0 in | Wt 231.0 lb

## 2024-03-03 DIAGNOSIS — Z794 Long term (current) use of insulin: Secondary | ICD-10-CM

## 2024-03-03 DIAGNOSIS — N182 Chronic kidney disease, stage 2 (mild): Secondary | ICD-10-CM | POA: Diagnosis not present

## 2024-03-03 DIAGNOSIS — E1122 Type 2 diabetes mellitus with diabetic chronic kidney disease: Secondary | ICD-10-CM

## 2024-03-03 DIAGNOSIS — E1159 Type 2 diabetes mellitus with other circulatory complications: Secondary | ICD-10-CM | POA: Diagnosis not present

## 2024-03-03 DIAGNOSIS — I152 Hypertension secondary to endocrine disorders: Secondary | ICD-10-CM

## 2024-03-03 LAB — POCT GLYCOSYLATED HEMOGLOBIN (HGB A1C): Hemoglobin A1C: 9.2 % — AB (ref 4.0–5.6)

## 2024-03-03 MED ORDER — LISINOPRIL 20 MG PO TABS
20.0000 mg | ORAL_TABLET | Freq: Every day | ORAL | 0 refills | Status: DC
Start: 2024-03-03 — End: 2024-06-10

## 2024-03-03 MED ORDER — FREESTYLE LIBRE 3 PLUS SENSOR MISC: 3 refills | Status: AC

## 2024-03-03 NOTE — Patient Instructions (Addendum)
 40 at night of Lantus  Humalog - take 20 unit three times daily before meals plus a correction dose:   150-199, give an additional 1 unit 202-249, give an additional 2 units 250-299, give an additional 3 units 300-349, give an additional 4 units >349, give an additional 5 units Please contact the clinic if you are consistently giving yourself the extra 5 units.   Wait three days after starting the adjusted Lantus, then start taking one correction dose before bedtime using the above scale (do NOT take the normal 20 unit of Humalog with this dose)

## 2024-03-03 NOTE — Progress Notes (Signed)
 Established patient visit   Patient: [name] [name]   DOB: 1951/09/19   73 y.o. Male  MRN: [id number] Visit Date: 03/03/2024  Today's healthcare provider: Carlean Charter, DO   Chief Complaint  Patient presents with   Diabetes    Patient presents for follow up of diabetes.  He was last seen last month.  He reports that his glucose has been ranging 50-300.  It is of note that patient had hand surgery on 02/20/24.  After the surgery he was put on antibiotics but has completed the course.  Patient was seen by Dr Verdia Glad for low blood pressure after his surgery and she made changes to his medications.  She told him top decrease his Lisinopril  by cutting it in half (making his dose 20 mg daily) and she discontinued his hydrochloro   Subjective    Diabetes  Last annual exam: 04/30/2023   [name] [name] is a 73 year old male with hypertension and diabetes who presents with low blood pressure and blood sugar management issues.  He has experienced low blood pressure following a recent trigger finger surgery, with readings dropping to as low as 111/41 mmHg. His antihypertensive regimen was adjusted by halving the lisinopril  dose and discontinuing hydrochlorothiazide , which has stabilized his blood pressure. He is no longer taking Bactrim, which was prescribed post-surgery.  He manages his diabetes with a sliding scale insulin  regimen. His A1c was 9.2, higher than usual, possibly due to the recent surgery. He uses a continuous glucose monitor and has experienced episodes of low blood sugar, particularly at night, with readings dropping to 52 mg/dL. He adjusts his insulin  dose to prevent these lows, reducing his nighttime Lantus dose from 65 units to 40-50 units and sometimes skipping it altogether. He also uses Humalog with meals and adjusts based on his blood sugar readings. - when BG is 130/140 at night and he takes 65 units, he drops into the 50s in the morning - when BG is 130/140 at night and he  takes 40 units, he is usually in the 120s in the morning Taking 20 units tid plus sliding scale. - 150-199, give an additional 1 unit - 202-249, give an additional 2 units - 250-299, give an additional 3 units - 300-349, give an additional 4 units - >349, give an additional 5 units. Contact the clinic if consistently taking the extra 5 units.  He takes Jardiance , which causes frequent urination, and is no longer taking pioglitazone . He drinks five glasses of water a day and has lost some weight. He is currently on metformin  and lisinopril , which he cuts in half. He has a supply of Lantus and Humalog for his insulin  needs.  He is planning a train trip to [place]  next week and will be traveling by train.      Medications: Outpatient Medications Prior to Visit  Medication Sig Note   acetaminophen  (TYLENOL ) 325 MG tablet Take 325 mg by mouth every 6 (six) hours as needed.    aspirin  EC 81 MG tablet Take 81 mg by mouth daily.     atorvastatin  (LIPITOR) 40 MG tablet Take 1 tablet (40 mg total) by mouth daily.    BOOSTRIX 5-2.5-18.5 LF-MCG/0.5 injection     Continuous Glucose Sensor (FREESTYLE LIBRE 3 SENSOR) MISC PLACE 1 SENSOR ON THE SKIN EVERY 14 DAYS. USE TO CHECK GLUCOSE CONTINUOUSLY    empagliflozin  (JARDIANCE ) 10 MG TABS tablet Take 1 tablet (10 mg total) by mouth daily before breakfast.  insulin  glargine (LANTUS SOLOSTAR) 100 UNIT/ML Solostar Pen INJECT 65 UNITS UNDER THE SKIN ONCE DAILY    insulin  lispro (HUMALOG KWIKPEN) 100 UNIT/ML KwikPen INJECT 20 UNUTS UNDER THE SKIN EVERY AM, 20 UNITS AT NOON, AND 20 UNITS WITH EVENING MEAL    latanoprost (XALATAN) 0.005 % ophthalmic solution USE 1 DROP IN BOTH EYES AT BEDTIME    metFORMIN  (GLUCOPHAGE ) 1000 MG tablet TAKE 1 TABLET BY MOUTH EVERY MORNING AND 1 AT BEDTIME (Patient taking differently: No sig reported)    nitroGLYCERIN  (NITROSTAT ) 0.4 MG SL tablet PLACE 1 TABLET UNDER THE TONGUE EVERY 5 MINUTES AS NEEDED FOR CHEST PAIN, SEEK  MEDICAL ATTENTION IF NO RELIEF AFTER 2ND DOSE    ondansetron  (ZOFRAN -ODT) 4 MG disintegrating tablet Take 1 tablet (4 mg total) by mouth every 8 (eight) hours as needed for nausea or vomiting.    pantoprazole  (PROTONIX ) 40 MG tablet TAKE 1 TABLET BY MOUTH EVERY DAY    PNEUMOVAX 23 25 MCG/0.5ML injection     verapamil  (CALAN -SR) 240 MG CR tablet TAKE 1 TABLET BY MOUTH EVERY DAY    [DISCONTINUED] lisinopril  (ZESTRIL ) 40 MG tablet TAKE 40 MG BY MOUTH DAILY. 03/03/2024: Patient takes 20 mg daily   [DISCONTINUED] pioglitazone  (ACTOS ) 45 MG tablet TAKE 1 TABLET BY MOUTH EVERY MORNING    ezetimibe  (ZETIA ) 10 MG tablet Take 1 tablet (10 mg total) by mouth daily.    [DISCONTINUED] hydrochlorothiazide  (HYDRODIURIL ) 25 MG tablet Take 1 tablet (25 mg total) by mouth daily.    No facility-administered medications prior to visit.        Objective    BP 128/73 (BP Location: Left Arm, Patient Position: Sitting, Cuff Size: Normal)   Pulse 72   Temp 97.7 F (36.5 C) (Oral)   Ht 5\' 11"  (1.803 m)   Wt 231 lb (104.8 kg)   SpO2 96%   BMI 32.22 kg/m     Physical Exam Vitals and nursing note reviewed.  Constitutional:      General: He is not in acute distress.    Appearance: Normal appearance.  HENT:     Head: Normocephalic and atraumatic.  Eyes:     General: No scleral icterus.    Conjunctiva/sclera: Conjunctivae normal.  Cardiovascular:     Rate and Rhythm: Normal rate.  Pulmonary:     Effort: Pulmonary effort is normal.  Neurological:     Mental Status: He is alert and oriented to person, place, and time. Mental status is at baseline.  Psychiatric:        Mood and Affect: Mood normal.        Behavior: Behavior normal.      Results for orders placed or performed in visit on 03/03/24  POCT glycosylated hemoglobin (Hb A1C)  Result Value Ref Range   Hemoglobin A1C 9.2 (A) 4.0 - 5.6 %   HbA1c POC (<> result, manual entry)     HbA1c, POC (prediabetic range)     HbA1c, POC (controlled  diabetic range)      Assessment & Plan    Type 2 diabetes mellitus with stage 2 chronic kidney disease, with long-term current use of insulin  (HCC) -     POCT glycosylated hemoglobin (Hb A1C) -     FreeStyle Libre 3 Plus Sensor; Change sensor every 15 days.  Dispense: 6 each; Refill: 3  Hypertension associated with diabetes (HCC) -     Lisinopril ; Take 1 tablet (20 mg total) by mouth daily.  Dispense: 90 tablet; Refill: 0  Type 2 Diabetes Mellitus with stage 2 chronic kidney disease, with long-term current use of insulin  A1c increased to 9.2% today. Recent surgery and recovery may have impacted glucose levels.  Recent adjustments to basal Lantus, including skipping doses, will also have affected this.  Experiences nocturnal hypoglycemia with blood glucose dropping to 52 mg/dL. Adjustments to insulin  regimen are necessary to prevent hypoglycemia and manage hyperglycemia. - Discussed dosing based on carbohydrates but patient is not willing to count carbohydrates at this time. - Adjust Lantus to 40 units at night. - Administer Humalog 20 units three times daily before meals plus correction dose.             150-199, give an additional 1 unit              202-249, give an additional 2 units              250-299, give an additional 3 units              300-349, give an additional 4 units              >349, give an additional 5 units              Please contact the clinic if you are consistently giving yourself the extra 5 units. - Wait three days after starting the adjusted Lantus, then start taking one correction dose before bedtime using the above scale (do NOT take the normal 20 unit of Humalog with this dose) - Log nighttime insulin  doses to assess adjustments. - Also discussed logging all insulin  doses to allow a better idea of how the current scale is affecting his blood sugar going forward; patient is not willing to do this either, feeling he would be spending too much time staring at  his phone.   - Send prescription for Libre 3 Plus CGM. - Schedule follow-up for next A1c evaluation. - Contact clinic prior to next appointment with any concerns   Hypertension with recent episodes of hypotension Chronic, stable.  Blood pressure readings previously as low as 113/52 mmHg. Hypotension may be related to fluid loss from Jardiance .  Currently stable on lisinopril  20 mg daily with concurrent use of Jardiance  - Maintain current lisinopril  dose at 20 mg daily.  Refill sent. - Removed hydrochlorothiazide  from medication list.     Return in about 3 months (around 06/03/2024).      I discussed the assessment and treatment plan with the patient  The patient was provided an opportunity to ask questions and all were answered. The patient agreed with the plan and demonstrated an understanding of the instructions.   The patient was advised to call back or seek an in-person evaluation if the symptoms worsen or if the condition fails to improve as anticipated.    Carlean Charter, DO  [facility] [phone] (phone) [phone] (fax)  [facility]

## 2024-03-21 ENCOUNTER — Other Ambulatory Visit: Payer: Self-pay | Admitting: Family Medicine

## 2024-03-21 DIAGNOSIS — E1122 Type 2 diabetes mellitus with diabetic chronic kidney disease: Secondary | ICD-10-CM

## 2024-04-08 ENCOUNTER — Encounter: Payer: Self-pay | Admitting: Nurse Practitioner

## 2024-04-08 ENCOUNTER — Ambulatory Visit (INDEPENDENT_AMBULATORY_CARE_PROVIDER_SITE_OTHER): Admitting: Nurse Practitioner

## 2024-04-08 VITALS — BP 124/60 | HR 69 | Temp 97.7°F | Ht 69.25 in | Wt 229.2 lb

## 2024-04-08 DIAGNOSIS — E1122 Type 2 diabetes mellitus with diabetic chronic kidney disease: Secondary | ICD-10-CM

## 2024-04-08 DIAGNOSIS — Z794 Long term (current) use of insulin: Secondary | ICD-10-CM | POA: Diagnosis not present

## 2024-04-08 DIAGNOSIS — N182 Chronic kidney disease, stage 2 (mild): Secondary | ICD-10-CM

## 2024-04-08 DIAGNOSIS — Z0000 Encounter for general adult medical examination without abnormal findings: Secondary | ICD-10-CM | POA: Diagnosis not present

## 2024-04-08 DIAGNOSIS — I152 Hypertension secondary to endocrine disorders: Secondary | ICD-10-CM | POA: Diagnosis not present

## 2024-04-08 DIAGNOSIS — E1159 Type 2 diabetes mellitus with other circulatory complications: Secondary | ICD-10-CM | POA: Diagnosis not present

## 2024-04-08 DIAGNOSIS — Z125 Encounter for screening for malignant neoplasm of prostate: Secondary | ICD-10-CM

## 2024-04-08 DIAGNOSIS — H401134 Primary open-angle glaucoma, bilateral, indeterminate stage: Secondary | ICD-10-CM | POA: Diagnosis not present

## 2024-04-08 NOTE — Assessment & Plan Note (Signed)
Discussed age-appropriate immunizations and screening exams.  Did review patient's personal, surgical, social, family histories.  Patient is up-to-date on all age-appropriate vaccinations he would like.  Patient is up-to-date on CRC screening.  PSA today for prostate cancer screening.  Patient was given information at discharge about preventative healthcare maintenance with anticipatory guidance

## 2024-04-08 NOTE — Assessment & Plan Note (Signed)
 History of the same.  Patient is on a Lantus sliding scale along with Humalog 20 mg 3 times daily and Jardiance  10 mg daily, and metformin  1000 mg twice daily.  He does use a CGM.  Too early to check Leukos today but he has been having readings no higher than the high 200s.  He has not tried a GLP-1 receptor agonist as of yet.

## 2024-04-08 NOTE — Patient Instructions (Signed)
 Nice to see you today I will be in touch with the labs once I have reviewed them Follow up with me in 2 months

## 2024-04-08 NOTE — Assessment & Plan Note (Signed)
 Patient currently maintained on lisinopril  20, verapamil  240 mg daily.  Blood pressure is well-controlled.  Continue taking medication as prescribed follow-up with cardiology as recommended

## 2024-04-08 NOTE — Assessment & Plan Note (Signed)
 Patient currently maintained on Xalatan drops and followed by ophthalmology every 6 months.  Continue

## 2024-04-08 NOTE — Progress Notes (Signed)
 New Patient Office Visit  Subjective    Patient ID: [name] [name], male    DOB: [birth year]  Age: 73 y.o. MRN: [id number]  CC:  Chief Complaint  Patient presents with   Establish Care    Requests physical for insurance purposes.    HPI Banjamin [name] presents to establish care   HTN: Patient currently maintained on lisinopril  20 mg daily, verapamil  240 mg daily.  Patient is followed by cardiology, Dr. Alvenia Aus. States he does check his blood pressure at home   Dm2: Patient currently maintained on Jardiance  10 mg, Lantus 40 SSI mg daily, Humalog 20 mg 3 times daily , metformin  1000 mg twice daily. He has been having the high 200s and no hypoglycemia   HLD: Patient currently maintained on atorvastatin  40 mg daily along with ASA 81 mg  Tdap: 2024 Flu: out of season  Covid: Originals and boosters Shingles: up to date Pna: up to date original and boosters  Colonoscopy: 02/20/2021. No recall  PSA: Due today  Diet: he is eating 3 meals a day sometimes 2 meals. Some snacking. He is drinking gatorade zero and water Exercise: states he walks the dog daily approx  Eye: [facility] in [place]. On xaltan and followed every 6 months   Sleep:goes to bed around 10 and gets up around 6. Does feels rested. Does snore snore   Outpatient Encounter Medications as of 04/08/2024  Medication Sig   aspirin  EC 81 MG tablet Take 81 mg by mouth daily.    atorvastatin  (LIPITOR) 40 MG tablet Take 1 tablet (40 mg total) by mouth daily.   Continuous Glucose Sensor (FREESTYLE LIBRE 3 PLUS SENSOR) MISC Change sensor every 15 days.   empagliflozin  (JARDIANCE ) 10 MG TABS tablet Take 1 tablet (10 mg total) by mouth daily before breakfast.   insulin  glargine (LANTUS SOLOSTAR) 100 UNIT/ML Solostar Pen INJECT 65 UNITS UNDER THE SKIN ONCE DAILY   insulin  lispro (HUMALOG KWIKPEN) 100 UNIT/ML KwikPen INJECT 20 UNUTS UNDER THE SKIN EVERY AM, 20 UNITS AT NOON, AND 20 UNITS WITH EVENING MEAL    latanoprost (XALATAN) 0.005 % ophthalmic solution USE 1 DROP IN BOTH EYES AT BEDTIME   lisinopril  (ZESTRIL ) 20 MG tablet Take 1 tablet (20 mg total) by mouth daily.   metFORMIN  (GLUCOPHAGE ) 1000 MG tablet TAKE 1 TABLET BY MOUTH EVERY MORNING AND 1 AT BEDTIME (Patient taking differently: No sig reported)   nitroGLYCERIN  (NITROSTAT ) 0.4 MG SL tablet PLACE 1 TABLET UNDER THE TONGUE EVERY 5 MINUTES AS NEEDED FOR CHEST PAIN, SEEK MEDICAL ATTENTION IF NO RELIEF AFTER 2ND DOSE   pantoprazole  (PROTONIX ) 40 MG tablet TAKE 1 TABLET BY MOUTH EVERY DAY   verapamil  (CALAN -SR) 240 MG CR tablet TAKE 1 TABLET BY MOUTH EVERY DAY   ezetimibe  (ZETIA ) 10 MG tablet Take 1 tablet (10 mg total) by mouth daily.   [DISCONTINUED] acetaminophen  (TYLENOL ) 325 MG tablet Take 325 mg by mouth every 6 (six) hours as needed.   [DISCONTINUED] BOOSTRIX 5-2.5-18.5 LF-MCG/0.5 injection    [DISCONTINUED] Continuous Glucose Sensor (FREESTYLE LIBRE 3 SENSOR) MISC PLACE 1 SENSOR ON THE SKIN EVERY 14 DAYS. USE TO CHECK GLUCOSE CONTINUOUSLY (Patient not taking: Reported on 04/08/2024)   [DISCONTINUED] ondansetron  (ZOFRAN -ODT) 4 MG disintegrating tablet Take 1 tablet (4 mg total) by mouth every 8 (eight) hours as needed for nausea or vomiting.   [DISCONTINUED] PNEUMOVAX 23 25 MCG/0.5ML injection    No facility-administered encounter medications on file as of 04/08/2024.    Past  Medical History:  Diagnosis Date   Arthritis    some arthritis and djd left knee - s/p partial left knee replacement -now has pain left knee, and knee gives away   Complication of anesthesia    uvula swelling--day after surgery-could not swallow--required steroids--this was after knee surgery 2010   Coronary artery spasm (HCC)    diagnosed in the 7o's after chest pains--pt states heart cath negative--was given verapamil  to take to prevent further spasms and pt states he has not had any other problems   Diabetes mellitus    GERD (gastroesophageal reflux disease)     Glaucoma    Hyperlipidemia    Hypertension associated with diabetes (HCC) 05/03/2023   Pericarditis 2012   tx'd at [facility]--with motrin 800 mg--hospitalized over night--no problems since-   Periodic edema     Past Surgical History:  Procedure Laterality Date   ANTERIOR FUSION CERVICAL SPINE  06/30/1979   CHOLECYSTECTOMY     ESOPHAGOGASTRODUODENOSCOPY N/A 01/14/2024   Procedure: EGD (ESOPHAGOGASTRODUODENOSCOPY);  Surgeon: Shane Darling, MD;  Location: [facility] ENDOSCOPY;  Service: Endoscopy;  Laterality: N/A;   FOREIGN BODY REMOVAL  06/16/2012   Procedure: REMOVAL FOREIGN BODY EXTREMITY;  Surgeon: Bevin Bucks, MD;  Location: [facility] ORS;  Service: Orthopedics;  Laterality: Left;   JOINT REPLACEMENT     left partial knee replacement  10/29/2008   surgery was at [facility] in [place], [place]   nasal septal recontruction  10/30/2011   POLYPECTOMY  01/14/2024   Procedure: POLYPECTOMY;  Surgeon: Shane Darling, MD;  Location: [facility] ENDOSCOPY;  Service: Endoscopy;;   SPINE SURGERY     TOTAL KNEE REVISION  06/16/2012   Procedure: TOTAL KNEE REVISION;  Surgeon: Bevin Bucks, MD;  Location: [facility] ORS;  Service: Orthopedics;  Laterality: Left;  Revision Left Partial Knee Femoral Component    Family History  Problem Relation Age of Onset   Diabetes Mellitus II Mother    Heart attack Mother    Hypertension Mother    Diabetes Mother    Heart disease Mother    Kidney disease Mother    Vision loss Mother    Coronary artery disease Father    Heart disease Father    Hypertension Father    Kidney disease Father    Deep vein thrombosis Maternal Grandmother     Social History   Socioeconomic History   Marital status: Married    Spouse name: Not on file   Number of children: 0   Years of education: Some college   Highest education level: Some college, no degree  Occupational History   Occupation: Retired  Tobacco Use   Smoking status: Never   Smokeless tobacco: Never  Vaping  Use   Vaping status: Never Used  Substance and Sexual Activity   Alcohol use: No   Drug use: No   Sexual activity: Not on file  Other Topics Concern   Not on file  Social History Narrative   Lives with Senica Crall, partner.    Caffeine use: none   Drinks diet sprite, ginger ale daily   Right handed   Social Drivers of Health   Financial Resource Strain: Low Risk  (01/10/2024)   Received from [facility]   Overall Financial Resource Strain (CARDIA)    Difficulty of Paying Living Expenses: Not hard at all  Food Insecurity: No Food Insecurity (01/10/2024)   Received from [facility]   Hunger Vital Sign    Worried About  Running Out of Food in the Last Year: Never true    Ran Out of Food in the Last Year: Never true  Transportation Needs: No Transportation Needs (01/10/2024)   Received from [facility] - Transportation    In the past 12 months, has lack of transportation kept you from medical appointments or from getting medications?: No    Lack of Transportation (Non-Medical): No  Physical Activity: Insufficiently Active (06/18/2023)   Exercise Vital Sign    Days of Exercise per Week: 4 days    Minutes of Exercise per Session: 30 min  Stress: No Stress Concern Present (06/18/2023)   [employer] of Occupational Health - Occupational Stress Questionnaire    Feeling of Stress : Not at all  Social Connections: Socially Integrated (06/18/2023)   Social Connection and Isolation Panel [NHANES]    Frequency of Communication with Friends and Family: More than three times a week    Frequency of Social Gatherings with Friends and Family: More than three times a week    Attends Religious Services: More than 4 times per year    Active Member of [employer] or Organizations: Yes    Attends Engineer, structural: More than 4 times per year    Marital Status: Married  Catering manager Violence: Not At Risk (06/18/2023)    Humiliation, Afraid, Rape, and Kick questionnaire    Fear of Current or Ex-Partner: No    Emotionally Abused: No    Physically Abused: No    Sexually Abused: No    Review of Systems  Constitutional:  Negative for chills and fever.  Respiratory:  Negative for shortness of breath.   Cardiovascular:  Negative for chest pain and leg swelling.  Gastrointestinal:  Negative for abdominal pain, blood in stool, constipation, diarrhea, nausea and vomiting.       BM  daily   Genitourinary:  Positive for frequency and urgency. Negative for dysuria and hematuria.  Neurological:  Negative for tingling and headaches.  Psychiatric/Behavioral:  Negative for hallucinations and suicidal ideas.         Objective    BP 124/60   Pulse 69   Temp 97.7 F (36.5 C) (Oral)   Ht 5' 9.25 (1.759 m)   Wt 229 lb 3.2 oz (104 kg)   SpO2 97%   BMI 33.60 kg/m   Physical Exam Vitals and nursing note reviewed.  Constitutional:      Appearance: Normal appearance.  HENT:     Right Ear: Tympanic membrane, ear canal and external ear normal.     Left Ear: Tympanic membrane, ear canal and external ear normal.     Mouth/Throat:     Mouth: Mucous membranes are moist.     Pharynx: Oropharynx is clear.  Eyes:     Extraocular Movements: Extraocular movements intact.     Pupils: Pupils are equal, round, and reactive to light.  Cardiovascular:     Rate and Rhythm: Normal rate and regular rhythm.     Pulses: Normal pulses.     Heart sounds: Normal heart sounds.  Pulmonary:     Effort: Pulmonary effort is normal.     Breath sounds: Normal breath sounds.  Abdominal:     General: Bowel sounds are normal. There is no distension.     Palpations: There is no mass.     Tenderness: There is no abdominal tenderness.     Hernia: No hernia is present.     Comments: Rectus diastasis  Musculoskeletal:     Right lower leg: No edema.     Left lower leg: No edema.  Lymphadenopathy:     Cervical: No cervical adenopathy.   Skin:    General: Skin is warm.  Neurological:     General: No focal deficit present.     Mental Status: He is alert.     Deep Tendon Reflexes:     Reflex Scores:      Bicep reflexes are 2+ on the right side and 2+ on the left side.      Patellar reflexes are 2+ on the right side and 2+ on the left side.    Comments: Bilateral upper and lower extremity strength 5/5  Psychiatric:        Mood and Affect: Mood normal.        Behavior: Behavior normal.        Thought Content: Thought content normal.        Judgment: Judgment normal.    Title   Diabetic Foot Exam - detailed Is there a history of foot ulcer?: No Is there a foot ulcer now?: No Is there swelling?: No Is there elevated skin temperature?: No Is there abnormal foot shape?: No Is there a claw toe deformity?: No Are the toenails long?: No Are the toenails thick?: Yes Are the toenails ingrown?: No Pulse Foot Exam completed.: Yes   Right Posterior Tibialis: Present Left posterior Tibialis: Present   Right Dorsalis Pedis: Present Left Dorsalis Pedis: Present     Sensory Foot Exam Completed.: Yes Semmes-Weinstein Monofilament Test + means has sensation and - means no sensation      Image components are not supported.   Image components are not supported. Image components are not supported.  Tuning Fork Comments All 10 sites tested sensation intact bilaterally          Assessment & Plan:   Problem List Items Addressed This Visit       Cardiovascular and Mediastinum   Hypertension associated with diabetes (HCC)   Patient currently maintained on lisinopril  20, verapamil  240 mg daily.  Blood pressure is well-controlled.  Continue taking medication as prescribed follow-up with cardiology as recommended      Relevant Orders   CBC   Comprehensive metabolic panel with GFR   TSH   Lipid panel     Endocrine   Type 2 diabetes mellitus with stage 2 chronic kidney disease, with long-term current use of  insulin  (HCC)   History of the same.  Patient is on a Lantus sliding scale along with Humalog 20 mg 3 times daily and Jardiance  10 mg daily, and metformin  1000 mg twice daily.  He does use a CGM.  Too early to check Leukos today but he has been having readings no higher than the high 200s.  He has not tried a GLP-1 receptor agonist as of yet.      Relevant Orders   CBC   Comprehensive metabolic panel with GFR   Microalbumin / creatinine urine ratio     Genitourinary   Chronic kidney disease (CKD) stage G2/A2, mildly decreased glomerular filtration rate (GFR) between 60-89 mL/min/1.73 square meter and albuminuria creatinine ratio between 30-299 mg/g   History of the same.  Pending Renal panel      Relevant Orders   PSA     Other   Preventative health care - Primary   Discussed age-appropriate immunizations and screening exams.  Did review patient's personal, surgical, social, family histories.  Patient is  up-to-date on all age-appropriate vaccinations he would like.  Patient is up-to-date on CRC screening.  PSA today for prostate cancer screening.  Patient was given information at discharge about preventative healthcare maintenance with anticipatory guidance.      Primary open angle glaucoma (POAG) of both eyes, indeterminate stage   Patient currently maintained on Xalatan drops and followed by ophthalmology every 6 months.  Continue      Other Visit Diagnoses       Screening for prostate cancer           Return in about 2 months (around 06/08/2024) for DM recheck.   Margarie Shay, NP

## 2024-04-08 NOTE — Assessment & Plan Note (Signed)
 History of the same.  Pending Renal panel

## 2024-04-09 ENCOUNTER — Ambulatory Visit: Payer: Self-pay | Admitting: Nurse Practitioner

## 2024-04-09 LAB — COMPREHENSIVE METABOLIC PANEL WITH GFR
AG Ratio: 1.6 (calc) (ref 1.0–2.5)
ALT: 31 U/L (ref 9–46)
AST: 24 U/L (ref 10–35)
Albumin: 4.3 g/dL (ref 3.6–5.1)
Alkaline phosphatase (APISO): 57 U/L (ref 35–144)
BUN: 15 mg/dL (ref 7–25)
CO2: 23 mmol/L (ref 20–32)
Calcium: 9.7 mg/dL (ref 8.6–10.3)
Chloride: 103 mmol/L (ref 98–110)
Creat: 1.09 mg/dL (ref 0.70–1.28)
Globulin: 2.7 g/dL (ref 1.9–3.7)
Glucose, Bld: 153 mg/dL — ABNORMAL HIGH (ref 65–99)
Potassium: 4.5 mmol/L (ref 3.5–5.3)
Sodium: 139 mmol/L (ref 135–146)
Total Bilirubin: 0.7 mg/dL (ref 0.2–1.2)
Total Protein: 7 g/dL (ref 6.1–8.1)
eGFR: 72 mL/min/{1.73_m2} (ref >=60)

## 2024-04-09 LAB — CBC
HCT: 42 % (ref 38.5–50.0)
Hemoglobin: 13.4 g/dL (ref 13.2–17.1)
MCH: 28.3 pg (ref 27.0–33.0)
MCHC: 31.9 g/dL — ABNORMAL LOW (ref 32.0–36.0)
MCV: 88.6 fL (ref 80.0–100.0)
MPV: 10.3 fL (ref 7.5–12.5)
Platelets: 265 10*3/uL (ref 140–400)
RBC: 4.74 10*6/uL (ref 4.20–5.80)
RDW: 13.6 % (ref 11.0–15.0)
WBC: 7.9 10*3/uL (ref 3.8–10.8)

## 2024-04-09 LAB — TSH: TSH: 2.27 m[IU]/L (ref 0.40–4.50)

## 2024-04-09 LAB — LIPID PANEL
Cholesterol: 104 mg/dL (ref <200)
HDL: 40 mg/dL (ref >=40)
LDL Cholesterol (Calc): 42 mg/dL
Non-HDL Cholesterol (Calc): 64 mg/dL (ref <130)
Total CHOL/HDL Ratio: 2.6 (calc) (ref <5.0)
Triglycerides: 135 mg/dL (ref <150)

## 2024-04-09 LAB — MICROALBUMIN / CREATININE URINE RATIO
Creatinine, Urine: 69 mg/dL (ref 20–320)
Microalb Creat Ratio: 28 mg/g{creat} (ref <30)
Microalb, Ur: 1.9 mg/dL

## 2024-04-09 LAB — PSA: PSA: 0.36 ng/mL (ref <=4.00)

## 2024-04-12 ENCOUNTER — Other Ambulatory Visit: Payer: Self-pay | Admitting: Family Medicine

## 2024-04-29 DIAGNOSIS — D2272 Melanocytic nevi of left lower limb, including hip: Secondary | ICD-10-CM | POA: Diagnosis not present

## 2024-04-29 DIAGNOSIS — D225 Melanocytic nevi of trunk: Secondary | ICD-10-CM | POA: Diagnosis not present

## 2024-04-29 DIAGNOSIS — Z85828 Personal history of other malignant neoplasm of skin: Secondary | ICD-10-CM | POA: Diagnosis not present

## 2024-04-29 DIAGNOSIS — D2262 Melanocytic nevi of left upper limb, including shoulder: Secondary | ICD-10-CM | POA: Diagnosis not present

## 2024-04-29 DIAGNOSIS — D2261 Melanocytic nevi of right upper limb, including shoulder: Secondary | ICD-10-CM | POA: Diagnosis not present

## 2024-04-29 DIAGNOSIS — L570 Actinic keratosis: Secondary | ICD-10-CM | POA: Diagnosis not present

## 2024-05-09 ENCOUNTER — Other Ambulatory Visit: Payer: Self-pay | Admitting: Family Medicine

## 2024-05-09 DIAGNOSIS — I152 Hypertension secondary to endocrine disorders: Secondary | ICD-10-CM

## 2024-05-11 ENCOUNTER — Ambulatory Visit: Admitting: Podiatry

## 2024-05-11 ENCOUNTER — Encounter: Payer: Self-pay | Admitting: Podiatry

## 2024-05-11 DIAGNOSIS — B351 Tinea unguium: Secondary | ICD-10-CM

## 2024-05-11 DIAGNOSIS — E1142 Type 2 diabetes mellitus with diabetic polyneuropathy: Secondary | ICD-10-CM | POA: Diagnosis not present

## 2024-05-11 DIAGNOSIS — M79676 Pain in unspecified toe(s): Secondary | ICD-10-CM | POA: Diagnosis not present

## 2024-05-11 NOTE — Progress Notes (Signed)
 He presents today for follow-up of his diabetic foot and his painful elongated toenails.  He states that his neuropathy is unchanged.  Objective: Vital signs are stable oriented x 3.  Pulses are palpable.  There is no erythema edema cellulitis drainage or odor.  Toenails are thick yellow dystrophic clinically mycotic no changes on physical exam as far as the neuropathy goes.  Assessment: Diabetes mellitus with diabetic peripheral neuropathy no open lesions or wounds.  Toenails are painful and mycotic.  Plan: Debridement of toenails 1 through 5 bilateral.

## 2024-05-24 ENCOUNTER — Other Ambulatory Visit: Payer: Self-pay | Admitting: Family Medicine

## 2024-05-24 DIAGNOSIS — E1122 Type 2 diabetes mellitus with diabetic chronic kidney disease: Secondary | ICD-10-CM

## 2024-05-27 ENCOUNTER — Encounter: Payer: Self-pay | Admitting: Family Medicine

## 2024-05-28 ENCOUNTER — Telehealth: Payer: Self-pay | Admitting: Nurse Practitioner

## 2024-05-28 ENCOUNTER — Ambulatory Visit

## 2024-05-28 VITALS — Ht 71.0 in | Wt 229.0 lb

## 2024-05-28 DIAGNOSIS — Z0000 Encounter for general adult medical examination without abnormal findings: Secondary | ICD-10-CM

## 2024-05-28 DIAGNOSIS — Z794 Long term (current) use of insulin: Secondary | ICD-10-CM

## 2024-05-28 MED ORDER — EMPAGLIFLOZIN 10 MG PO TABS
10.0000 mg | ORAL_TABLET | Freq: Every day | ORAL | 3 refills | Status: DC
Start: 2024-05-28 — End: 2024-05-28

## 2024-05-28 MED ORDER — EMPAGLIFLOZIN 10 MG PO TABS: 10.0000 mg | ORAL_TABLET | Freq: Every day | ORAL | 1 refills | Status: AC

## 2024-05-28 NOTE — Patient Instructions (Signed)
 [name] , Thank you for taking time out of your busy schedule to complete your Annual Wellness Visit with me. I enjoyed our conversation and look forward to speaking with you again next year. I, as well as your care team,  appreciate your ongoing commitment to your health goals. Please review the following plan we discussed and let me know if I can assist you in the future. Your Game plan/ To Do List    Follow up Visits: Next Medicare AWV with our clinical staff: 05/31/25 @ 9:30am televisit   Have you seen your provider in the last 6 months (3 months if uncontrolled diabetes)? Yes Next Office Visit with your provider: 06/10/24  Clinician Recommendations:  Aim for 30 minutes of exercise or brisk walking, 6-8 glasses of water, and 5 servings of fruits and vegetables each day.       This is a list of the screening recommended for you and due dates:  Health Maintenance  Topic Date Due   Hepatitis B Vaccine (2 of 3 - 19+ 3-dose series) 12/08/2007   Eye exam for diabetics  05/16/2024   COVID-19 Vaccine (8 - Moderna risk 2024-25 season) 06/29/2024*   Flu Shot  05/29/2024   Hemoglobin A1C  09/03/2024   Yearly kidney function blood test for diabetes  04/08/2025   Yearly kidney health urinalysis for diabetes  04/08/2025   Complete foot exam   04/08/2025   Medicare Annual Wellness Visit  05/28/2025   Colon Cancer Screening  01/29/2031   DTaP/Tdap/Td vaccine (2 - Td or Tdap) 08/23/2033   Pneumococcal Vaccine for age over 69  Completed   Zoster (Shingles) Vaccine  Completed   HPV Vaccine  Aged Out   Meningitis B Vaccine  Aged Out   Hepatitis C Screening  Discontinued  *Topic was postponed. The date shown is not the original due date.    Advanced directives: (Copy Requested) Please bring a copy of your health care power of attorney and living will to the office to be added to your chart at your convenience. You can mail to [facility] [address] [place], [place] [address] or  email to [email or web address] Advance Care Planning is important because it:  [x]  Makes sure you receive the medical care that is consistent with your values, goals, and preferences  [x]  It provides guidance to your family and loved ones and reduces their decisional burden about whether or not they are making the right decisions based on your wishes.  Follow the link provided in your after visit summary or read over the paperwork we have mailed to you to help you started getting your Advance Directives in place. If you need assistance in completing these, please reach out to us  so that we can help you!

## 2024-05-28 NOTE — Addendum Note (Signed)
 Addended by: WENDEE LYNWOOD HERO on: 05/28/2024 10:13 AM   Modules accepted: Orders

## 2024-05-28 NOTE — Progress Notes (Signed)
 Subjective:   [name] [name] is a 73 y.o. who presents for a Medicare Wellness preventive visit.  As a reminder, Annual Wellness Visits don't include a physical exam, and some assessments may be limited, especially if this visit is performed virtually. We may recommend an in-person follow-up visit with your provider if needed.  Visit Complete: Virtual I connected with  Alm Shoulder on 05/28/24 by a audio enabled telemedicine application and verified that I am speaking with the correct person using two identifiers.  Patient Location: Home  Provider Location: Office/Clinic  I discussed the limitations of evaluation and management by telemedicine. The patient expressed understanding and agreed to proceed.  Vital Signs: Because this visit was a virtual/telehealth visit, some criteria may be missing or patient reported. Any vitals not documented were not able to be obtained and vitals that have been documented are patient reported.  VideoDeclined- This patient declined Librarian, academic. Therefore the visit was completed with audio only.  Persons Participating in Visit: Patient.  AWV Questionnaire: No: Patient Medicare AWV questionnaire was not completed prior to this visit.  Cardiac Risk Factors include: advanced age (>41men, >87 women);diabetes mellitus;dyslipidemia;male gender;hypertension;obesity (BMI >30kg/m2)     Objective:    Today's Vitals   05/28/24 0931  Weight: 229 lb (103.9 kg)  Height: 5' 11 (1.803 m)   Body mass index is 31.94 kg/m.     05/28/2024    9:45 AM 01/14/2024    8:58 AM 06/18/2023    9:34 AM 12/24/2017    5:48 AM 10/20/2014   11:11 AM 06/16/2012    3:43 PM 06/16/2012    7:27 AM  Advanced Directives  Does Patient Have a Medical Advance Directive? Yes Yes Yes No  No;Yes  Patient has advance directive, copy not in chart    Type of Advance Directive Healthcare Power of [place];Living will  Healthcare Power of [place];Living will    Healthcare Power of [place];Living will    Copy of Healthcare Power of Attorney in Chart? No - copy requested    No - copy requested  Copy requested from family    Would patient like information on creating a medical advance directive?    No - Patient declined  No - patient declined information     Pre-existing out of facility DNR order (yellow form or pink MOST form)      No  No      Data saved with a previous flowsheet row definition    Current Medications (verified) Outpatient Encounter Medications as of 05/28/2024  Medication Sig   aspirin  EC 81 MG tablet Take 81 mg by mouth daily.    atorvastatin  (LIPITOR) 40 MG tablet Take 1 tablet (40 mg total) by mouth daily.   Continuous Glucose Sensor (FREESTYLE LIBRE 3 PLUS SENSOR) MISC Change sensor every 15 days.   empagliflozin  (JARDIANCE ) 10 MG TABS tablet Take 1 tablet (10 mg total) by mouth daily before breakfast.   empagliflozin  (JARDIANCE ) 10 MG TABS tablet Take 1 tablet by mouth daily before breakfast.   ezetimibe  (ZETIA ) 10 MG tablet Take 1 tablet (10 mg total) by mouth daily.   insulin  glargine (LANTUS SOLOSTAR) 100 UNIT/ML Solostar Pen INJECT 65 UNITS UNDER THE SKIN ONCE DAILY   insulin  lispro (HUMALOG KWIKPEN) 100 UNIT/ML KwikPen INJECT 20 UNUTS UNDER THE SKIN EVERY AM, 20 UNITS AT NOON, AND 20 UNITS WITH EVENING MEAL   ketoconazole  (NIZORAL ) 2 % cream APPLY TOPICALLY DAILY FOR 6 WEEKS OR UNTIL RESOLVED   latanoprost (  XALATAN) 0.005 % ophthalmic solution USE 1 DROP IN BOTH EYES AT BEDTIME   latanoprost (XALATAN) 0.005 % ophthalmic solution Apply 1 drop to eye.   lisinopril  (ZESTRIL ) 20 MG tablet Take 1 tablet (20 mg total) by mouth daily.   lisinopril  (ZESTRIL ) 20 MG tablet Take 1 tablet by mouth daily.   metFORMIN  (GLUCOPHAGE ) 1000 MG tablet TAKE 1 TABLET BY MOUTH EVERY MORNING AND 1 AT BEDTIME   nitroGLYCERIN  (NITROSTAT ) 0.4 MG SL tablet PLACE 1 TABLET UNDER THE TONGUE EVERY 5 MINUTES AS NEEDED FOR CHEST PAIN, SEEK MEDICAL ATTENTION  IF NO RELIEF AFTER 2ND DOSE   pantoprazole  (PROTONIX ) 40 MG tablet TAKE 1 TABLET BY MOUTH EVERY DAY   traMADol (ULTRAM) 50 MG tablet Take 1 tablet by mouth every 6 (six) hours as needed.   verapamil  (CALAN -SR) 240 MG CR tablet TAKE 1 TABLET BY MOUTH EVERY DAY   BACTRIM DS 800-160 MG tablet Take 1 tablet twice a day by oral route for 3 days. (Patient not taking: Reported on 05/28/2024)   lisinopril  (ZESTRIL ) 40 MG tablet Take 1 tablet by mouth daily. (Patient not taking: Reported on 05/28/2024)   No facility-administered encounter medications on file as of 05/28/2024.    Allergies (verified) Azithromycin, Iodinated contrast media, Other, Doxycycline calcium , Ketoconazole , and Vancomycin   History: Past Medical History:  Diagnosis Date   Arthritis    some arthritis and djd left knee - s/p partial left knee replacement -now has pain left knee, and knee gives away   Complication of anesthesia    uvula swelling--day after surgery-could not swallow--required steroids--this was after knee surgery 2010   Coronary artery spasm (HCC)    diagnosed in the 7o's after chest pains--pt states heart cath negative--was given verapamil  to take to prevent further spasms and pt states he has not had any other problems   Diabetes mellitus    GERD (gastroesophageal reflux disease)    Glaucoma    Hyperlipidemia    Hypertension associated with diabetes (HCC) 05/03/2023   Pericarditis 2012   tx'd at [facility]--with motrin 800 mg--hospitalized over night--no problems since-   Periodic edema    Past Surgical History:  Procedure Laterality Date   ANTERIOR FUSION CERVICAL SPINE  06/30/1979   CHOLECYSTECTOMY     ESOPHAGOGASTRODUODENOSCOPY N/A 01/14/2024   Procedure: EGD (ESOPHAGOGASTRODUODENOSCOPY);  Surgeon: Maryruth Ole DASEN, MD;  Location: [facility] ENDOSCOPY;  Service: Endoscopy;  Laterality: N/A;   FOREIGN BODY REMOVAL  06/16/2012   Procedure: REMOVAL FOREIGN BODY EXTREMITY;  Surgeon: Donnice JONETTA Car, MD;  Location: [facility]  ORS;  Service: Orthopedics;  Laterality: Left;   JOINT REPLACEMENT     left partial knee replacement  10/29/2008   surgery was at [facility] in [place], [place]   nasal septal recontruction  10/30/2011   POLYPECTOMY  01/14/2024   Procedure: POLYPECTOMY;  Surgeon: Maryruth Ole DASEN, MD;  Location: [facility] ENDOSCOPY;  Service: Endoscopy;;   SPINE SURGERY     TOTAL KNEE REVISION  06/16/2012   Procedure: TOTAL KNEE REVISION;  Surgeon: Donnice JONETTA Car, MD;  Location: [facility] ORS;  Service: Orthopedics;  Laterality: Left;  Revision Left Partial Knee Femoral Component   Family History  Problem Relation Age of Onset   Diabetes Mellitus II Mother    Heart attack Mother    Hypertension Mother    Diabetes Mother    Heart disease Mother    Kidney disease Mother    Vision loss Mother    Coronary artery disease Father  Heart disease Father    Hypertension Father    Kidney disease Father    Deep vein thrombosis Maternal Grandmother    Social History   Socioeconomic History   Marital status: Married    Spouse name: Not on file   Number of children: 0   Years of education: Some college   Highest education level: Some college, no degree  Occupational History   Occupation: Retired  Tobacco Use   Smoking status: Never   Smokeless tobacco: Never  Vaping Use   Vaping status: Never Used  Substance and Sexual Activity   Alcohol use: No   Drug use: No   Sexual activity: Not on file  Other Topics Concern   Not on file  Social History Narrative   Lives with Keo Schirmer, partner.    Caffeine use: none   Drinks diet sprite, ginger ale daily   Right handed   Social Drivers of Health   Financial Resource Strain: Low Risk  (05/28/2024)   Overall Financial Resource Strain (CARDIA)    Difficulty of Paying Living Expenses: Not hard at all  Food Insecurity: No Food Insecurity (05/28/2024)   Hunger Vital Sign    Worried About Running Out of Food in the Last Year: Never true    Ran Out of Food  in the Last Year: Never true  Transportation Needs: No Transportation Needs (05/28/2024)   PRAPARE - Administrator, Civil Service (Medical): No    Lack of Transportation (Non-Medical): No  Physical Activity: Insufficiently Active (05/28/2024)   Exercise Vital Sign    Days of Exercise per Week: 4 days    Minutes of Exercise per Session: 30 min  Stress: No Stress Concern Present (05/28/2024)   [employer] of Occupational Health - Occupational Stress Questionnaire    Feeling of Stress: Not at all  Social Connections: Socially Integrated (05/28/2024)   Social Connection and Isolation Panel    Frequency of Communication with Friends and Family: More than three times a week    Frequency of Social Gatherings with Friends and Family: More than three times a week    Attends Religious Services: More than 4 times per year    Active Member of [employer] or Organizations: Yes    Attends Engineer, structural: More than 4 times per year    Marital Status: Married    Tobacco Counseling Counseling given: Not Answered    Clinical Intake:  Pre-visit preparation completed: Yes  Pain : No/denies pain     BMI - recorded: 31.94 Nutritional Status: BMI > 30  Obese Nutritional Risks: None Diabetes: Yes CBG done?: Yes (BS run around 120 in am) CBG resulted in Enter/ Edit results?: No Did pt. bring in CBG monitor from home?: No  Lab Results  Component Value Date   HGBA1C 9.2 (A) 03/03/2024   HGBA1C 9.1 (H) 11/28/2023   HGBA1C 7.6 (H) 04/30/2023     How often do you need to have someone help you when you read instructions, pamphlets, or other written materials from your doctor or pharmacy?: 1 - Never  Interpreter Needed?: No  Comments: lives with spouse Information entered by :: B.Nemiah Kissner,LPN   Activities of Daily Living     05/28/2024    9:46 AM 06/18/2023    9:34 AM  In your present state of health, do you have any difficulty performing the following activities:   Hearing? 0 0  Vision? 0 1  Difficulty concentrating or making decisions? 0 0  Walking  or climbing stairs? 0 0  Dressing or bathing? 0 0  Doing errands, shopping? 0 0  Preparing Food and eating ? N N  Using the Toilet? N N  In the past six months, have you accidently leaked urine? N N  Do you have problems with loss of bowel control? N N  Managing your Medications? N N  Managing your Finances? N N  Housekeeping or managing your Housekeeping? N N    Patient Care Team: Wendee Lynwood HERO, NP as PCP - General (Nurse Practitioner) Darron Deatrice LABOR, MD as PCP - Cardiology (Cardiology) [facility], [place]  I have updated your Care Teams any recent Medical Services you may have received from other providers in the past year.     Assessment:   This is a routine wellness examination for [name] [name].  Hearing/Vision screen Hearing Screening - Comments:: Pt says his hearing is good Vision Screening - Comments:: Pt says his vision is good;readers only [facility]   Goals Addressed             This Visit's Progress    Weight (lb) < 200 lb (90.7 kg)   229 lb (103.9 kg)    05/28/24-Pt wants to lose 20lb.Pt will walk a little more and increase his water consumption.       Depression Screen     05/28/2024    9:43 AM 04/08/2024    9:34 AM 01/28/2024    9:57 AM 11/28/2023   10:37 AM 06/18/2023    9:30 AM 04/30/2023    8:16 AM 10/20/2014   11:12 AM  PHQ 2/9 Scores  PHQ - 2 Score 0 0 0 0 0 0 0  PHQ- 9 Score  0 0        Fall Risk     05/28/2024    9:35 AM 04/08/2024    9:35 AM 11/28/2023   10:37 AM 06/18/2023    9:27 AM 04/30/2023    8:16 AM  Fall Risk   Falls in the past year? 0 0 0 0 0  Number falls in past yr: 0 0 0 0   Injury with Fall? 0 0 0 0 0  Risk for fall due to : No Fall Risks No Fall Risks No Fall Risks No Fall Risks No Fall Risks  Follow up Education provided;Falls prevention discussed Falls evaluation completed  Education provided;Falls prevention discussed  Falls evaluation completed    MEDICARE RISK AT HOME:  Medicare Risk at Home Any stairs in or around the home?: No If so, are there any without handrails?: Yes Home free of loose throw rugs in walkways, pet beds, electrical cords, etc?: Yes Adequate lighting in your home to reduce risk of falls?: Yes Life alert?: No Use of a cane, walker or w/c?: No Grab bars in the bathroom?: No Shower chair or bench in shower?: Yes Elevated toilet seat or a handicapped toilet?: Yes  TIMED UP AND GO:  Was the test performed?  No  Cognitive Function: 6CIT completed        05/28/2024    9:46 AM 06/18/2023    9:35 AM  6CIT Screen  What Year? 0 points 0 points  What month? 0 points 0 points  What time? 0 points 0 points  Count back from 20 0 points 0 points  Months in reverse  0 points  Repeat phrase  0 points  Total Score  0 points    Immunizations Immunization History  Administered Date(s) Administered  sv, Bivalent, Protein Subunit Rsvpref,pf (Abrysvo) 06/21/2022   Fluad Quad(high Dose 65+) 07/31/2022   Hepatitis B, ADULT 11/10/2007   Influenza Split 07/29/2015   Influenza, High Dose Seasonal PF 07/30/2016, 07/30/2016, 07/29/2017, 07/26/2020, 07/08/2023   Influenza, Seasonal, Injecte, Preservative Fre 07/10/2013   Influenza-Unspecified 08/03/2011, 08/03/2011, 07/11/2015, 07/29/2015, 07/30/2016, 07/29/2017   Moderna Covid-19 Fall Seasonal Vaccine 70yrs & older 07/20/2022   Moderna Covid-19 Vaccine Bivalent Booster 61yrs & up 07/11/2021   Moderna Sars-Covid-2 Vaccination 11/18/2019, 12/16/2019, 06/11/2020, 12/05/2020   Pfizer(Comirnaty)Fall Seasonal Vaccine 12 years and older 07/08/2023   Pneumococcal Conjugate-13 08/08/2015   Pneumococcal Polysaccharide-23 01/14/2013, 01/14/2013, 07/29/2017, 07/29/2017, 08/14/2023   Tdap 08/24/2023   Zoster Recombinant(Shingrix) 09/11/2017, 09/30/2017, 12/01/2017   Zoster, Live 10/16/2012    Screening Tests Health Maintenance  Topic Date Due    Hepatitis B Vaccines (2 of 3 - 19+ 3-dose series) 12/08/2007   COVID-19 Vaccine (8 - Moderna risk 2024-25 season) 06/29/2024 (Originally 01/05/2024)   INFLUENZA VACCINE  05/29/2024   HEMOGLOBIN A1C  09/03/2024   OPHTHALMOLOGY EXAM  01/12/2025   Diabetic kidney evaluation - eGFR measurement  04/08/2025   Diabetic kidney evaluation - Urine ACR  04/08/2025   FOOT EXAM  04/08/2025   Medicare Annual Wellness (AWV)  05/28/2025   Colonoscopy  01/29/2031   DTaP/Tdap/Td (2 - Td or Tdap) 08/23/2033   Pneumococcal Vaccine: 50+ Years  Completed   Zoster Vaccines- Shingrix  Completed   HPV VACCINES  Aged Out   Meningococcal B Vaccine  Aged Out   Hepatitis C Screening  Discontinued    Health Maintenance  Health Maintenance Due  Topic Date Due   Hepatitis B Vaccines (2 of 3 - 19+ 3-dose series) 12/08/2007   Health Maintenance Items Addressed: Eye appt done 01/13/24 w/Dr Scarlet  Additional Screening:  Vision Screening: Recommended annual ophthalmology exams for early detection of glaucoma and other disorders of the eye. Would you like a referral to an eye doctor? No    Dental Screening: Recommended annual dental exams for proper oral hygiene  Community Resource Referral / Chronic Care Management: CRR required this visit?  No   CCM required this visit?  No   Plan:    I have personally reviewed and noted the following in the patient's chart:   Medical and social history Use of alcohol, tobacco or illicit drugs  Current medications and supplements including opioid prescriptions. Patient is currently taking opioid prescriptions. Information provided to patient regarding non-opioid alternatives. Patient advised to discuss non-opioid treatment plan with their provider. Functional ability and status Nutritional status Physical activity Advanced directives List of other physicians Hospitalizations, surgeries, and ER visits in previous 12 months Vitals Screenings to include cognitive,  depression, and falls Referrals and appointments  In addition, I have reviewed and discussed with patient certain preventive protocols, quality metrics, and best practice recommendations. A written personalized care plan for preventive services as well as general preventive health recommendations were provided to patient.   Erminio LITTIE Saris, LPN   2/68/7974   After Visit Summary: (MyChart) Due to this being a telephonic visit, the after visit summary with patients personalized plan was offered to patient via MyChart   Notes: Please refer to Routing Comments.

## 2024-05-28 NOTE — Telephone Encounter (Signed)
 Copied from CRM [id number]. Topic: Clinical - Medication Refill >> May 28, 2024  7:54 AM [place] A wrote: Medication: empagliflozin  (JARDIANCE ) 10 MG TABS tablet  Has the patient contacted their pharmacy? Yes (Agent: If no, request that the patient contact the pharmacy for the refill. If patient does not wish to contact the pharmacy document the reason why and proceed with request.) (Agent: If yes, when and what did the pharmacy advise?) Contact Primary  This is the patient's preferred pharmacy:  [facility] [phone] [facility], [place] - [address] [place] [address] Phone: [phone] Fax: [phone]  Is this the correct pharmacy for this prescription? Yes If no, delete pharmacy and type the correct one.   Has the prescription been filled recently? No  Is the patient out of the medication? Yes  Has the patient been seen for an appointment in the last year OR does the patient have an upcoming appointment? Yes  Can we respond through MyChart? Yes  Agent: Please be advised that Rx refills may take up to 3 business days. We ask that you follow-up with your pharmacy.

## 2024-05-30 ENCOUNTER — Other Ambulatory Visit: Payer: Self-pay | Admitting: Family Medicine

## 2024-05-30 DIAGNOSIS — E1159 Type 2 diabetes mellitus with other circulatory complications: Secondary | ICD-10-CM

## 2024-06-03 ENCOUNTER — Ambulatory Visit: Admitting: Family Medicine

## 2024-06-08 ENCOUNTER — Other Ambulatory Visit: Payer: Self-pay | Admitting: Family Medicine

## 2024-06-10 ENCOUNTER — Ambulatory Visit (INDEPENDENT_AMBULATORY_CARE_PROVIDER_SITE_OTHER): Admitting: Nurse Practitioner

## 2024-06-10 DIAGNOSIS — E1159 Type 2 diabetes mellitus with other circulatory complications: Secondary | ICD-10-CM

## 2024-06-10 DIAGNOSIS — Z794 Long term (current) use of insulin: Secondary | ICD-10-CM | POA: Diagnosis not present

## 2024-06-10 DIAGNOSIS — E1122 Type 2 diabetes mellitus with diabetic chronic kidney disease: Secondary | ICD-10-CM

## 2024-06-10 DIAGNOSIS — E782 Mixed hyperlipidemia: Secondary | ICD-10-CM

## 2024-06-10 DIAGNOSIS — I152 Hypertension secondary to endocrine disorders: Secondary | ICD-10-CM | POA: Diagnosis not present

## 2024-06-10 DIAGNOSIS — N182 Chronic kidney disease, stage 2 (mild): Secondary | ICD-10-CM | POA: Diagnosis not present

## 2024-06-10 LAB — POCT GLYCOSYLATED HEMOGLOBIN (HGB A1C): Hemoglobin A1C: 9.8 % — AB (ref 4.0–5.6)

## 2024-06-10 MED ORDER — LISINOPRIL 20 MG PO TABS: 20.0000 mg | ORAL_TABLET | Freq: Every day | ORAL | 2 refills | Status: DC

## 2024-06-10 MED ORDER — METFORMIN HCL 1000 MG PO TABS: 1000.0000 mg | ORAL_TABLET | Freq: Two times a day (BID) | ORAL | 1 refills | Status: AC

## 2024-06-10 MED ORDER — INSULIN LISPRO (1 UNIT DIAL) 100 UNIT/ML (KWIKPEN)
20.0000 [IU] | PEN_INJECTOR | Freq: Three times a day (TID) | SUBCUTANEOUS | 4 refills | Status: AC
Start: 2024-06-10 — End: ?

## 2024-06-10 MED ORDER — OZEMPIC (0.25 OR 0.5 MG/DOSE) 2 MG/3ML ~~LOC~~ SOPN
0.2500 mg | PEN_INJECTOR | SUBCUTANEOUS | 0 refills | Status: DC
Start: 2024-06-10 — End: 2024-07-15

## 2024-06-10 MED ORDER — ATORVASTATIN CALCIUM 40 MG PO TABS: 40.0000 mg | ORAL_TABLET | Freq: Every day | ORAL | 3 refills | Status: AC

## 2024-06-10 MED ORDER — LANTUS SOLOSTAR 100 UNIT/ML ~~LOC~~ SOPN: 60.0000 [IU] | PEN_INJECTOR | Freq: Every day | SUBCUTANEOUS | 2 refills | Status: AC

## 2024-06-10 NOTE — Assessment & Plan Note (Signed)
 Patient currently maintained on verapamil  240 mg daily lisinopril  20 mg daily.  Patient's blood pressure is controlled continue medication as prescribed.

## 2024-06-10 NOTE — Patient Instructions (Signed)
 Nice to see you today I have sent in a new diabetes medication call ozempic  that you will give yourself once a week every week Follow up with me in 3 months, sooner if you need me

## 2024-06-10 NOTE — Progress Notes (Signed)
 Established Patient Office Visit  Subjective   Patient ID: [name] [name], male    DOB: [birth year]  Age: 73 y.o. MRN: [id number]  Chief Complaint  Patient presents with   Diabetes    HPI  DM2: Patient currently maintained on Jardiance  10 mg daily, Lantus  65 units daily (sliding scale), Humalog  3 times daily, metformin  1000 mg twice daily.  Patient is have a CGM to monitor his blood glucose levels States that he was on vacation the other week for a week at the beach. States that he did not eat well  States that this monring was 141. In the evenings he is going up.  States the he has been getting approx 60 units  and he has had a few low sugars early morning   He has been walking. He is having hip trouble. 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 He does diet soda and water   HTN: Patient currently maintained on verapamil  240 mg daily, lisinopril  20 mg daily.   Review of Systems  Constitutional:  Negative for chills and fever.  Respiratory:  Negative for shortness of breath.   Cardiovascular:  Negative for chest pain.  Gastrointestinal:        BM daily   Neurological:  Negative for dizziness and headaches.  Psychiatric/Behavioral:  Negative for hallucinations and suicidal ideas.       Objective:     BP 124/70   Pulse 76   Temp 97.6 F (36.4 C) (Oral)   Ht 5' 11 (1.803 m)   Wt 226 lb 12.8 oz (102.9 kg)   SpO2 92%   BMI 31.63 kg/m  BP Readings from Last 3 Encounters:  06/10/24 124/70  04/08/24 124/60  03/03/24 128/73   Wt Readings from Last 3 Encounters:  06/10/24 226 lb 12.8 oz (102.9 kg)  05/28/24 229 lb (103.9 kg)  04/08/24 229 lb 3.2 oz (104 kg)   SpO2 Readings from Last 3 Encounters:  06/10/24 92%  04/08/24 97%  03/03/24 96%      Physical Exam Vitals and nursing note reviewed.  Constitutional:      Appearance: Normal appearance.  Cardiovascular:     Rate and Rhythm: Normal rate and regular rhythm.     Heart sounds: Normal heart  sounds.  Pulmonary:     Effort: Pulmonary effort is normal.     Breath sounds: Normal breath sounds.  Abdominal:     General: Bowel sounds are normal.  Neurological:     Mental Status: He is alert.      Results for orders placed or performed in visit on 06/10/24  POCT glycosylated hemoglobin (Hb A1C)  Result Value Ref Range   Hemoglobin A1C 9.8 (A) 4.0 - 5.6 %   HbA1c POC (<> result, manual entry)     HbA1c, POC (prediabetic range)     HbA1c, POC (controlled diabetic range)        The ASCVD Risk score (Arnett DK, et al., 2019) failed to calculate for the following reasons:   The valid total cholesterol range is 130 to 320 mg/dL    Assessment & Plan:   Problem List Items Addressed This Visit       Cardiovascular and Mediastinum   Hypertension associated with diabetes [facility])   Patient currently maintained on verapamil  240 mg daily lisinopril  20 mg daily.  Patient's blood pressure is controlled continue medication as prescribed.      Relevant Medications   atorvastatin  (LIPITOR) 40 MG tablet  insulin  glargine (LANTUS  SOLOSTAR) 100 UNIT/ML Solostar Pen   insulin  lispro (HUMALOG  KWIKPEN) 100 UNIT/ML KwikPen   lisinopril  (ZESTRIL ) 20 MG tablet   metFORMIN  (GLUCOPHAGE ) 1000 MG tablet   Semaglutide ,0.25 or 0.5MG /DOS, (OZEMPIC , 0.25 OR 0.5 MG/DOSE,) 2 MG/3ML SOPN     Endocrine   Type 2 diabetes mellitus with stage 2 chronic kidney disease, with long-term current use of insulin  (HCC)   Patient currently maintained on Humalog  20 mg 3 times daily along with Lantus  sliding scale but getting about 60 units daily currently.  Patient is also maintained on metformin  1000 mg twice daily and Jardiance  10 mg daily A1c not controlled.  Will add Ozempic  0.25 mg once a week for 4 weeks then anticipate increase to 0.5 mg weekly thereafter.  Patient does have a CGM monitor for hypoglycemia.      Relevant Medications   atorvastatin  (LIPITOR) 40 MG tablet   insulin  glargine (LANTUS  SOLOSTAR)  100 UNIT/ML Solostar Pen   insulin  lispro (HUMALOG  KWIKPEN) 100 UNIT/ML KwikPen   lisinopril  (ZESTRIL ) 20 MG tablet   metFORMIN  (GLUCOPHAGE ) 1000 MG tablet   Semaglutide ,0.25 or 0.5MG /DOS, (OZEMPIC , 0.25 OR 0.5 MG/DOSE,) 2 MG/3ML SOPN   Other Relevant Orders   POCT glycosylated hemoglobin (Hb A1C) (Completed)     Other   Mixed hyperlipidemia   Relevant Medications   atorvastatin  (LIPITOR) 40 MG tablet   lisinopril  (ZESTRIL ) 20 MG tablet    Return in about 3 months (around 09/10/2024) for DM recheck.    Adina Crandall, NP

## 2024-06-10 NOTE — Assessment & Plan Note (Signed)
 Patient currently maintained on Humalog  20 mg 3 times daily along with Lantus  sliding scale but getting about 60 units daily currently.  Patient is also maintained on metformin  1000 mg twice daily and Jardiance  10 mg daily A1c not controlled.  Will add Ozempic  0.25 mg once a week for 4 weeks then anticipate increase to 0.5 mg weekly thereafter.  Patient does have a CGM monitor for hypoglycemia.

## 2024-07-08 ENCOUNTER — Other Ambulatory Visit: Payer: Self-pay | Admitting: Cardiovascular Disease

## 2024-07-14 ENCOUNTER — Other Ambulatory Visit: Payer: Self-pay | Admitting: Nurse Practitioner

## 2024-07-14 DIAGNOSIS — N182 Chronic kidney disease, stage 2 (mild): Secondary | ICD-10-CM

## 2024-07-15 ENCOUNTER — Other Ambulatory Visit: Payer: Self-pay | Admitting: Family Medicine

## 2024-07-15 ENCOUNTER — Other Ambulatory Visit: Payer: Self-pay | Admitting: Nurse Practitioner

## 2024-07-20 DIAGNOSIS — E119 Type 2 diabetes mellitus without complications: Secondary | ICD-10-CM | POA: Diagnosis not present

## 2024-07-20 DIAGNOSIS — Z961 Presence of intraocular lens: Secondary | ICD-10-CM | POA: Diagnosis not present

## 2024-07-20 DIAGNOSIS — Z7985 Long-term (current) use of injectable non-insulin antidiabetic drugs: Secondary | ICD-10-CM | POA: Diagnosis not present

## 2024-07-20 DIAGNOSIS — Z794 Long term (current) use of insulin: Secondary | ICD-10-CM | POA: Diagnosis not present

## 2024-07-20 DIAGNOSIS — H401131 Primary open-angle glaucoma, bilateral, mild stage: Secondary | ICD-10-CM | POA: Diagnosis not present

## 2024-07-20 DIAGNOSIS — Z7984 Long term (current) use of oral hypoglycemic drugs: Secondary | ICD-10-CM | POA: Diagnosis not present

## 2024-07-25 ENCOUNTER — Other Ambulatory Visit: Payer: Self-pay | Admitting: Family Medicine

## 2024-07-28 ENCOUNTER — Other Ambulatory Visit: Payer: Self-pay | Admitting: Family Medicine

## 2024-07-28 DIAGNOSIS — I152 Hypertension secondary to endocrine disorders: Secondary | ICD-10-CM

## 2024-07-28 DIAGNOSIS — I201 Angina pectoris with documented spasm: Secondary | ICD-10-CM

## 2024-08-10 NOTE — Progress Notes (Signed)
 [name] [name]                                          MRN: [id number]   08/10/2024   The [facility] Quality Team Specialist reviewed this patient medical record for the purposes of chart review for care gap closure. The following were reviewed: chart review for care gap closure-glycemic status assessment.    [facility] Quality Team

## 2024-08-12 ENCOUNTER — Ambulatory Visit: Admitting: Podiatry

## 2024-08-12 DIAGNOSIS — B351 Tinea unguium: Secondary | ICD-10-CM

## 2024-08-12 DIAGNOSIS — M79676 Pain in unspecified toe(s): Secondary | ICD-10-CM

## 2024-08-12 DIAGNOSIS — E1142 Type 2 diabetes mellitus with diabetic polyneuropathy: Secondary | ICD-10-CM

## 2024-08-12 NOTE — Progress Notes (Signed)
 He presents today for follow-up of his diabetic foot and his painful elongated toenails.  He states that his neuropathy is unchanged.  Objective: Vital signs are stable oriented x 3.  Pulses are palpable.  There is no erythema edema cellulitis drainage or odor.  Toenails are thick yellow dystrophic clinically mycotic no changes on physical exam as far as the neuropathy goes.  Assessment: Diabetes mellitus with diabetic peripheral neuropathy no open lesions or wounds.  Toenails are painful and mycotic.  Plan: Debridement of toenails 1 through 5 bilateral.

## 2024-08-22 ENCOUNTER — Other Ambulatory Visit: Payer: Self-pay | Admitting: Family Medicine

## 2024-08-22 DIAGNOSIS — I152 Hypertension secondary to endocrine disorders: Secondary | ICD-10-CM

## 2024-08-22 DIAGNOSIS — I201 Angina pectoris with documented spasm: Secondary | ICD-10-CM

## 2024-08-31 ENCOUNTER — Ambulatory Visit: Payer: Self-pay

## 2024-08-31 NOTE — Telephone Encounter (Signed)
 Noted. Will evaluate in office

## 2024-08-31 NOTE — Telephone Encounter (Signed)
 FYI Only or Action Required?: Action required by provider: request for appointment.  Patient was last seen in primary care on 06/10/2024 by Wendee Lynwood HERO, NP.  Called Nurse Triage reporting Abdominal Pain.  Symptoms began several days ago.  Interventions attempted: Nothing.  Symptoms are: unchanged. Slight problem with constipation. Stools are hard, abdominal pain that comes and goes.  Triage Disposition: See Physician Within 24 Hours  Patient/caregiver understands and will follow disposition?: Yes     Copied from CRM [id number]. Topic: Clinical - Red Word Triage >> Aug 31, 2024 10:38 AM Alexandria E wrote: [facility] that prompted transfer to Nurse Triage: Right side abdomen pain, going on 4-5 days with change in bowel movements. Patient stated he is a bit more constipated, when that is usually not an issue. Patient rated pain 6 on a scale 1-10. Answer Assessment - Initial Assessment Questions 1. LOCATION: Where does it hurt?      Right side, at [place] 2. RADIATION: Does the pain shoot anywhere else? (e.g., chest, back)     no 3. ONSET: When did the pain begin? (Minutes, hours or days ago)      5 days 4. SUDDEN: Gradual or sudden onset?     sudden 5. PATTERN Does the pain come and go, or is it constant?     Comes and goes 6. SEVERITY: How bad is the pain?  (e.g., Scale 1-10; mild, moderate, or severe)     6 7. RECURRENT SYMPTOM: Have you ever had this type of stomach pain before? If Yes, ask: When was the last time? and What happened that time?      no 8. CAUSE: What do you think is causing the stomach pain? (e.g., gallstones, recent abdominal surgery)     Maybe constipation 9. RELIEVING/AGGRAVATING FACTORS: What makes it better or worse? (e.g., antacids, bending or twisting motion, bowel movement)     no 10. OTHER SYMPTOMS: Do you have any other symptoms? (e.g., back pain, diarrhea, fever, urination pain, vomiting)       Stools are hard  Protocols used:  Abdominal Pain - Male-A-AH  Reason for Disposition  [1] MODERATE pain (e.g., interferes with normal activities) AND [2] pain comes and goes (cramps) AND [3] present > 24 hours  (Exception: Pain with Vomiting or Diarrhea - see that Guideline.)  Answer Assessment - Initial Assessment Questions 1. LOCATION: Where does it hurt?      Right side, at [place] 2. RADIATION: Does the pain shoot anywhere else? (e.g., chest, back)     no 3. ONSET: When did the pain begin? (Minutes, hours or days ago)      5 days 4. SUDDEN: Gradual or sudden onset?     sudden 5. PATTERN Does the pain come and go, or is it constant?     Comes and goes 6. SEVERITY: How bad is the pain?  (e.g., Scale 1-10; mild, moderate, or severe)     6 7. RECURRENT SYMPTOM: Have you ever had this type of stomach pain before? If Yes, ask: When was the last time? and What happened that time?      no 8. CAUSE: What do you think is causing the stomach pain? (e.g., gallstones, recent abdominal surgery)     Maybe constipation 9. RELIEVING/AGGRAVATING FACTORS: What makes it better or worse? (e.g., antacids, bending or twisting motion, bowel movement)     no 10. OTHER SYMPTOMS: Do you have any other symptoms? (e.g., back pain, diarrhea, fever, urination pain, vomiting)  Stools are hard  Protocols used: Abdominal Pain - Male-A-AH

## 2024-09-01 ENCOUNTER — Ambulatory Visit
Admission: RE | Admit: 2024-09-01 | Discharge: 2024-09-01 | Disposition: A | Discharge status: Home / Self Care | Source: Ambulatory Visit | Attending: Nurse Practitioner | Admitting: Nurse Practitioner

## 2024-09-01 ENCOUNTER — Ambulatory Visit: Admitting: Nurse Practitioner

## 2024-09-01 VITALS — BP 114/62 | HR 69 | Temp 98.3°F | Ht 71.0 in | Wt 220.8 lb

## 2024-09-01 DIAGNOSIS — R1031 Right lower quadrant pain: Secondary | ICD-10-CM | POA: Diagnosis not present

## 2024-09-01 LAB — CBC WITH DIFFERENTIAL/PLATELET
Basophils Absolute: 0 K/uL (ref 0.0–0.1)
Basophils Relative: 0.5 % (ref 0.0–3.0)
Eosinophils Absolute: 0.1 K/uL (ref 0.0–0.7)
Eosinophils Relative: 1.8 % (ref 0.0–5.0)
HCT: 42.6 % (ref 39.0–52.0)
Hemoglobin: 14.1 g/dL (ref 13.0–17.0)
Lymphocytes Relative: 37.9 % (ref 12.0–46.0)
Lymphs Abs: 2.5 K/uL (ref 0.7–4.0)
MCHC: 33.1 g/dL (ref 30.0–36.0)
MCV: 83 fl (ref 78.0–100.0)
Monocytes Absolute: 0.7 K/uL (ref 0.1–1.0)
Monocytes Relative: 10.7 % (ref 3.0–12.0)
Neutro Abs: 3.3 K/uL (ref 1.4–7.7)
Neutrophils Relative %: 49.1 % (ref 43.0–77.0)
Platelets: 234 K/uL (ref 150.0–400.0)
RBC: 5.13 Mil/uL (ref 4.22–5.81)
RDW: 14.8 % (ref 11.5–15.5)
WBC: 6.7 K/uL (ref 4.0–10.5)

## 2024-09-01 LAB — COMPREHENSIVE METABOLIC PANEL WITH GFR
ALT: 26 U/L (ref 0–53)
AST: 21 U/L (ref 0–37)
Albumin: 4.2 g/dL (ref 3.5–5.2)
Alkaline Phosphatase: 66 U/L (ref 39–117)
BUN: 14 mg/dL (ref 6–23)
CO2: 27 meq/L (ref 19–32)
Calcium: 9.1 mg/dL (ref 8.4–10.5)
Chloride: 104 meq/L (ref 96–112)
Creatinine, Ser: 1.28 mg/dL (ref 0.40–1.50)
GFR: 55.3 mL/min — ABNORMAL LOW (ref >60.00)
Glucose, Bld: 142 mg/dL — ABNORMAL HIGH (ref 70–99)
Potassium: 4.1 meq/L (ref 3.5–5.1)
Sodium: 139 meq/L (ref 135–145)
Total Bilirubin: 0.8 mg/dL (ref 0.2–1.2)
Total Protein: 7.3 g/dL (ref 6.0–8.3)

## 2024-09-01 LAB — LIPASE: Lipase: 94 U/L — ABNORMAL HIGH (ref 11.0–59.0)

## 2024-09-01 NOTE — Progress Notes (Signed)
 Established Patient Office Visit  Subjective   Patient ID: [name] [name], male    DOB: 1951/07/08  Age: 73 y.o. MRN: [id number]  Chief Complaint  Patient presents with   Abdominal Pain    Pt complains of right sided abdominal pain ongoing for 4-5 days. States of slight nausea and straining when urinating. pt is worried about appendix.     Discussed the use of AI scribe software for clinical note transcription with the patient, who gave verbal consent to proceed.  History of Present Illness [name] [name] is a 73 year old male who presents with abdominal pain and nausea.  He has been experiencing sharp, intermittent abdominal pain located in the lower abdomen for the past four to five days, often worsening after eating. The pain is not alleviated by fluid intake but can be affected by movement. He also experiences nausea, which has reduced his appetite, though he is still able to eat. No vomiting, fever, chills, chest pain, or shortness of breath.  He has a history of gallbladder removal, after which he experienced loose stools. Recently, his bowel movements have become more firm, requiring straining. There is no blood in stool or urine, and no issues with urination. He has a history of kidney stones but states that this pain does not feel similar to previous kidney stone episodes.  His current medications include aspirin , atorvastatin , Jardiance , Zetia , Ozempic  at 0.25 mg, and insulin . He has not been taking metformin  recently. He has an allergy to IV contrast, which causes him to feel hot and flushed.  No recent injuries or changes in gas production.     Review of Systems  Constitutional:  Negative for chills and fever.  Respiratory:  Negative for shortness of breath.   Cardiovascular:  Negative for chest pain.  Gastrointestinal:  Positive for abdominal pain and nausea. Negative for constipation, diarrhea and vomiting.      Objective:     BP 114/62   Pulse 69   Temp 98.3 F (36.8  C) (Oral)   Ht 5' 11 (1.803 m)   Wt 220 lb 12.8 oz (100.2 kg)   SpO2 96%   BMI 30.80 kg/m    Physical Exam Vitals and nursing note reviewed.  Constitutional:      Appearance: Normal appearance.  Cardiovascular:     Rate and Rhythm: Normal rate and regular rhythm.     Heart sounds: Normal heart sounds.  Pulmonary:     Effort: Pulmonary effort is normal.     Breath sounds: Normal breath sounds.  Abdominal:     General: Bowel sounds are normal.     Palpations: There is mass.     Tenderness: There is abdominal tenderness. There is no right CVA tenderness or left CVA tenderness.  Neurological:     Mental Status: He is alert.      No results found for any visits on 09/01/24.    The ASCVD Risk score (Arnett DK, et al., 2019) failed to calculate for the following reasons:   The valid total cholesterol range is 130 to 320 mg/dL    Assessment & Plan:   Problem List Items Addressed This Visit       Other   RLQ abdominal pain - Primary   Relevant Orders   CT ABDOMEN PELVIS WO CONTRAST   CBC with Differential/Platelet   Comprehensive metabolic panel with GFR   Lipase   Urinalysis w microscopic + reflex cultur  Assessment and Plan Assessment & Plan Abdominal pain with  associated nausea Intermittent sharp lower abdominal pain with nausea, worsens postprandially. Differential includes appendicitis. Non-contrast CT preferred due to IV contrast allergy. - Order non-contrast CT scan of the abdomen in . - Obtain laboratory tests. - Instruct to go to the hospital if high fever, severe abdominal pain, or rock-hard abdomen develops. - Attempt to obtain a urine sample if possible. -R/o appendicitis or mass.  Allergy to IV contrast Allergy to IV contrast with previous flushing reaction. Premedication with prednisone required if contrast is necessary, affecting blood sugar levels. With uncontrolled diabetes - Plan for non-contrast CT scan initially.  Type 2 diabetes  mellitus with stage 2 chronic kidney disease Type 2 diabetes managed with insulin  and other medications. Concern about prednisone's impact on blood sugar if used for contrast allergy premedication.     Return if symptoms worsen or fail to improve, for as scheduled .    Adina Crandall, NP

## 2024-09-02 ENCOUNTER — Ambulatory Visit: Payer: Self-pay | Admitting: Nurse Practitioner

## 2024-09-02 LAB — URINALYSIS W MICROSCOPIC + REFLEX CULTURE
Bacteria, UA: NONE SEEN /HPF
Bilirubin Urine: NEGATIVE
Hgb urine dipstick: NEGATIVE
Hyaline Cast: NONE SEEN /LPF
Ketones, ur: NEGATIVE
Leukocyte Esterase: NEGATIVE
Nitrites, Initial: NEGATIVE
RBC / HPF: NONE SEEN /HPF (ref 0–2)
Specific Gravity, Urine: 1.027 (ref 1.001–1.035)
Squamous Epithelial / HPF: NONE SEEN /HPF (ref <=5)
WBC, UA: NONE SEEN /HPF (ref 0–5)
pH: <=5 — AB (ref 5.0–8.0)

## 2024-09-02 LAB — NO CULTURE INDICATED

## 2024-09-10 ENCOUNTER — Ambulatory Visit: Admitting: Nurse Practitioner

## 2024-09-10 VITALS — BP 122/60 | HR 68 | Temp 97.6°F | Ht 71.0 in | Wt 221.0 lb

## 2024-09-10 DIAGNOSIS — Z794 Long term (current) use of insulin: Secondary | ICD-10-CM | POA: Diagnosis not present

## 2024-09-10 DIAGNOSIS — E1122 Type 2 diabetes mellitus with diabetic chronic kidney disease: Secondary | ICD-10-CM | POA: Diagnosis not present

## 2024-09-10 DIAGNOSIS — N182 Chronic kidney disease, stage 2 (mild): Secondary | ICD-10-CM | POA: Diagnosis not present

## 2024-09-10 LAB — POCT GLYCOSYLATED HEMOGLOBIN (HGB A1C): Hemoglobin A1C: 10.3 % — AB (ref 4.0–5.6)

## 2024-09-10 MED ORDER — SEMAGLUTIDE (1 MG/DOSE) 4 MG/3ML ~~LOC~~ SOPN: 1.0000 mg | PEN_INJECTOR | SUBCUTANEOUS | 1 refills | Status: AC

## 2024-09-10 NOTE — Progress Notes (Signed)
 Established Patient Office Visit  Subjective   Patient ID: [name] [name], male    DOB: 1950/12/02  Age: 73 y.o. MRN: [id number]  Chief Complaint  Patient presents with   Diabetes    Received diabetic eye exam 4 months ago.      Discussed the use of AI scribe software for clinical note transcription with the patient, who gave verbal consent to proceed.  History of Present Illness [name] [name] is a 73 year old male with diabetes who presents for a follow-up on blood sugar management.  His blood sugar levels have been fluctuating, with readings typically in the 200s and occasionally reaching the 300s, particularly in the evenings. Levels can drop to the 50s, sometimes occurring in the afternoon between lunch and dinner. He uses a continuous glucose monitor to track his blood sugar levels. He is currently on 60 units of Lantus  and 20 units of Humalog  with meals. He recently stopped taking metformin  due to concerns about side effects. He is also on Ozempic , and his A1c has increased from 7.6 a year ago to 10.3 currently.  He engages in daily physical activity, walking two to three miles a day with a neighbor before the weather turned cold, and now walks his dog and to the mailbox, which is about a mile round trip. He eats three meals a day with some snacking, typically peanut butter crackers. He manages constipation, a side effect of Ozempic , with Miralax  and prunes, and notes that his bowel movements have been regular over the past four days.  No fever, chills, chest pain, or shortness of breath. He drinks plenty of fluids, including water and zero-sugar Gatorade. Family history includes diabetes on both his mother's and father's sides. His mother had a rapid form of diabetes that affected her eyes and legs, leading to her death.     Review of Systems  Constitutional:  Negative for chills and fever.  Respiratory:  Negative for shortness of breath.   Cardiovascular:  Negative for chest pain.   Gastrointestinal:  Positive for constipation.       BM daily to every other day  Neurological:  Negative for headaches.      Objective:     BP 122/60   Pulse 68   Temp 97.6 F (36.4 C) (Oral)   Ht 5' 11 (1.803 m)   Wt 221 lb (100.2 kg)   SpO2 98%   BMI 30.82 kg/m  BP Readings from Last 3 Encounters:  09/10/24 122/60  09/01/24 114/62  06/10/24 124/70   Wt Readings from Last 3 Encounters:  09/10/24 221 lb (100.2 kg)  09/01/24 220 lb 12.8 oz (100.2 kg)  06/10/24 226 lb 12.8 oz (102.9 kg)   SpO2 Readings from Last 3 Encounters:  09/10/24 98%  09/01/24 96%  06/10/24 92%      Physical Exam Vitals and nursing note reviewed.  Constitutional:      Appearance: Normal appearance.  Cardiovascular:     Rate and Rhythm: Normal rate and regular rhythm.     Heart sounds: Normal heart sounds.  Pulmonary:     Effort: Pulmonary effort is normal.     Breath sounds: Normal breath sounds.  Abdominal:     General: Bowel sounds are normal.  Neurological:     Mental Status: He is alert.      Results for orders placed or performed in visit on 09/10/24  POCT glycosylated hemoglobin (Hb A1C)  Result Value Ref Range   Hemoglobin A1C 10.3 (A) 4.0 -  5.6 %   HbA1c POC (<> result, manual entry)     HbA1c, POC (prediabetic range)     HbA1c, POC (controlled diabetic range)        The ASCVD Risk score (Arnett DK, et al., 2019) failed to calculate for the following reasons:   The valid total cholesterol range is 130 to 320 mg/dL    Assessment & Plan:   Problem List Items Addressed This Visit       Endocrine   Type 2 diabetes mellitus with stage 2 chronic kidney disease, with long-term current use of insulin  (HCC) - Primary   Relevant Medications   Semaglutide , 1 MG/DOSE, 4 MG/3ML SOPN   Other Relevant Orders   POCT glycosylated hemoglobin (Hb A1C) (Completed)   Assessment and Plan Assessment & Plan Type 2 diabetes mellitus with stage 2 chronic kidney disease A1c  increased to 10.3. Blood glucose fluctuates between 200-300 mg/dL, with occasional hypoglycemia. Discontinued metformin  due to side effects. Current regimen includes Lantus , Humalog , and Ozempic . Discussed risks of uncontrolled diabetes and emphasized metformin 's benefits. Discussed hypoglycemia and weight gain risks with insulin . - Increased Ozempic  to 1 mg after finishing current pen. - Encouraged resumption of metformin . - Continue current insulin  regimen. - Encouraged daily walking and dietary modifications. - Will monitor blood glucose levels and A1c.  Constipation due to Ozempic  Constipation managed with Miralax  and dried prunes. Discussed potential for increased constipation with higher Ozempic  dose. Emphasized hydration and dietary fiber. - Continue Miralax  and dried prunes as needed. - Increase fluid intake, including zero sugar Gatorade and water. - Monitor bowel movements and adjust treatment as needed.   Return in about 3 months (around 12/11/2024) for DM recheck.    Adina Crandall, NP

## 2024-09-10 NOTE — Patient Instructions (Signed)
 Nice to see you today I am increasing the ozempic  to 1mg  weekly Get back on to the Metformin   Follow up with me in 3 months

## 2024-09-16 ENCOUNTER — Ambulatory Visit: Payer: Self-pay

## 2024-09-16 NOTE — Telephone Encounter (Signed)
 Copied from CRM [id number]. Topic: General - Other >> Sep 16, 2024 10:23 AM Nessti S wrote: Reason for CRM: pt called to verify who is the md over pcp. Call back number [phone]

## 2024-09-23 NOTE — Progress Notes (Signed)
 [name] [name]                                          MRN: [id number]   09/23/2024   The [facility]BC[facility] Quality Team Specialist reviewed this patient medical record for the purposes of chart review for care gap closure. The following were reviewed: chart review for care gap closure-glycemic status assessment.    [facility]BCI Quality Team

## 2024-09-28 ENCOUNTER — Telehealth: Payer: Self-pay | Admitting: Nurse Practitioner

## 2024-09-28 NOTE — Telephone Encounter (Signed)
-----   Message from [facility] sent at 09/10/2024  9:27 AM EST ----- Regarding: metformin  Has he started back on the metformin  and tolerated it

## 2024-09-28 NOTE — Telephone Encounter (Signed)
 Left detailed voicemail for patient to call the office back.

## 2024-09-28 NOTE — Telephone Encounter (Signed)
 Has he started back on the metformin  and tolerated it

## 2024-09-29 NOTE — Telephone Encounter (Signed)
 Left detailed voicemail for patient to call the office back.

## 2024-10-01 NOTE — Telephone Encounter (Signed)
 Mailed letter to pt' address on file/.

## 2024-10-01 NOTE — Telephone Encounter (Signed)
 Left voicemail for patient to call the office back.

## 2024-10-15 ENCOUNTER — Other Ambulatory Visit: Payer: Self-pay | Admitting: Nurse Practitioner

## 2024-10-15 DIAGNOSIS — E1122 Type 2 diabetes mellitus with diabetic chronic kidney disease: Secondary | ICD-10-CM

## 2024-10-15 MED ORDER — INSULIN LISPRO (1 UNIT DIAL) 100 UNIT/ML (KWIKPEN): 20.0000 [IU] | PEN_INJECTOR | Freq: Three times a day (TID) | SUBCUTANEOUS | 4 refills | Status: AC

## 2024-10-15 NOTE — Telephone Encounter (Signed)
 Copied from CRM #[id number]. Topic: Clinical - Medication Refill >> Oct 15, 2024 12:23 PM Shereese L wrote: Medication: insulin  lispro (HUMALOG  KWIKPEN) 100 UNIT/ML KwikPen  Has the patient contacted their pharmacy? Yes (Agent: If no, request that the patient contact the pharmacy for the refill. If patient does not wish to contact the pharmacy document the reason why and proceed with request.) (Agent: If yes, when and what did the pharmacy advise?)  This is the patient's preferred pharmacy:  [facility] [phone] [place] - [address] [place] [address] Phone: [phone] Fax: [phone]  Is this the correct pharmacy for this prescription? Yes If no, delete pharmacy and type the correct one.   Has the prescription been filled recently? Yes  Is the patient out of the medication? Yes  Has the patient been seen for an appointment in the last year OR does the patient have an upcoming appointment? Yes  Can we respond through MyChart? Yes  Agent: Please be advised that Rx refills may take up to 3 business days. We ask that you follow-up with your pharmacy.

## 2024-11-10 ENCOUNTER — Ambulatory Visit
Admission: RE | Admit: 2024-11-10 | Discharge: 2024-11-10 | Disposition: A | Payer: Medicare (Managed Care) | Source: Ambulatory Visit | Attending: Nurse Practitioner

## 2024-11-10 ENCOUNTER — Ambulatory Visit: Payer: Medicare (Managed Care) | Admitting: Nurse Practitioner

## 2024-11-10 VITALS — BP 130/60 | HR 80 | Temp 97.7°F | Ht 71.0 in | Wt 217.8 lb

## 2024-11-10 DIAGNOSIS — R1033 Periumbilical pain: Secondary | ICD-10-CM

## 2024-11-10 DIAGNOSIS — E1159 Type 2 diabetes mellitus with other circulatory complications: Secondary | ICD-10-CM | POA: Diagnosis not present

## 2024-11-10 DIAGNOSIS — N182 Chronic kidney disease, stage 2 (mild): Secondary | ICD-10-CM

## 2024-11-10 DIAGNOSIS — E1122 Type 2 diabetes mellitus with diabetic chronic kidney disease: Secondary | ICD-10-CM | POA: Diagnosis not present

## 2024-11-10 DIAGNOSIS — Z794 Long term (current) use of insulin: Secondary | ICD-10-CM

## 2024-11-10 DIAGNOSIS — I152 Hypertension secondary to endocrine disorders: Secondary | ICD-10-CM | POA: Diagnosis not present

## 2024-11-10 DIAGNOSIS — M546 Pain in thoracic spine: Secondary | ICD-10-CM | POA: Diagnosis not present

## 2024-11-10 LAB — COMPREHENSIVE METABOLIC PANEL WITH GFR
ALT: 24 U/L (ref 3–53)
AST: 18 U/L (ref 5–37)
Albumin: 4.4 g/dL (ref 3.5–5.2)
Alkaline Phosphatase: 63 U/L (ref 39–117)
BUN: 20 mg/dL (ref 6–23)
CO2: 27 meq/L (ref 19–32)
Calcium: 9.3 mg/dL (ref 8.4–10.5)
Chloride: 102 meq/L (ref 96–112)
Creatinine, Ser: 1.22 mg/dL (ref 0.40–1.50)
GFR: 58.5 mL/min — ABNORMAL LOW
Glucose, Bld: 142 mg/dL — ABNORMAL HIGH (ref 70–99)
Potassium: 4.3 meq/L (ref 3.5–5.1)
Sodium: 139 meq/L (ref 135–145)
Total Bilirubin: 0.7 mg/dL (ref 0.2–1.2)
Total Protein: 7.5 g/dL (ref 6.0–8.3)

## 2024-11-10 LAB — CBC
HCT: 42.2 % (ref 39.0–52.0)
Hemoglobin: 14.3 g/dL (ref 13.0–17.0)
MCHC: 33.9 g/dL (ref 30.0–36.0)
MCV: 84 fl (ref 78.0–100.0)
Platelets: 239 K/uL (ref 150.0–400.0)
RBC: 5.02 Mil/uL (ref 4.22–5.81)
RDW: 15.3 % (ref 11.5–15.5)
WBC: 8.3 K/uL (ref 4.0–10.5)

## 2024-11-10 LAB — LIPASE: Lipase: 59 U/L (ref 11.0–59.0)

## 2024-11-10 NOTE — Progress Notes (Signed)
 "  Established Patient Office Visit  Subjective   Patient ID: [name] [name], male    DOB: Aug 15, 1951  Age: 74 y.o. MRN: [id number]  Chief Complaint  Patient presents with   GI Problem    Pt complains of nausea, pain in right side of stomach and pain in back . No urinary issues. Symptoms started a couple weeks ago. Pt is unsure if its due to ozempic  side effects. Pt states of vomiting a few times       With a history of DM2, angina, HTN, GERD, CKD, kidney stone  Discussed the use of AI scribe software for clinical note transcription with the patient, who gave verbal consent to proceed.  History of Present Illness [name] [name] is a 74 year old male with diabetes who presents with nausea and vomiting.  He has been experiencing nausea and vomiting for the past two weeks, which he initially attributed to his medication regimen. He has vomited twice, and the nausea typically occurs after taking his regular medications, including Ozempic . Despite these symptoms, his A1c has improved from 10.3 to 7.4 per his CGM, and he has lost weight, now weighing 217 pounds.  He describes intermittent sharp pain in the middle of his back, radiating to his shoulder blades, which began around the same time as the nausea. The pain is not constant and does not correlate with the nausea or vomiting. He takes Tylenol  for pain relief. He has a history of kidney stones.  No shortness of breath, chest pain, fever, chills, or changes in urination. He reports regular bowel movements without difficulty. He no longer takes night insulin  due to hypoglycemia, resulting in morning blood sugar levels under 100 mg/dL.  During a recent church visit, someone noted that his right eye appeared droopy, which he attributes to weight loss. He confirms that he still has his appendix but not his gallbladder. He has not noticed any blood in his urine or other urinary symptoms.    Review of Systems  Constitutional:  Negative for chills and  fever.  Respiratory:  Negative for shortness of breath.   Cardiovascular:  Negative for chest pain.  Gastrointestinal:  Positive for abdominal pain, nausea and vomiting.  Musculoskeletal:  Positive for back pain.      Objective:     BP 130/60   Pulse 80   Temp 97.7 F (36.5 C) (Oral)   Ht 5' 11 (1.803 m)   Wt 217 lb 12.8 oz (98.8 kg)   SpO2 99%   BMI 30.38 kg/m  BP Readings from Last 3 Encounters:  11/10/24 130/60  09/10/24 122/60  09/01/24 114/62   Wt Readings from Last 3 Encounters:  11/10/24 217 lb 12.8 oz (98.8 kg)  09/10/24 221 lb (100.2 kg)  09/01/24 220 lb 12.8 oz (100.2 kg)   SpO2 Readings from Last 3 Encounters:  11/10/24 99%  09/10/24 98%  09/01/24 96%      Physical Exam Vitals and nursing note reviewed.  Constitutional:      Appearance: Normal appearance.  HENT:     Mouth/Throat:     Mouth: Mucous membranes are moist.     Pharynx: Oropharynx is clear.  Cardiovascular:     Rate and Rhythm: Normal rate and regular rhythm.     Heart sounds: Normal heart sounds.  Pulmonary:     Effort: Pulmonary effort is normal.     Breath sounds: Normal breath sounds.  Abdominal:     General: Bowel sounds are normal. There is no distension.  Tenderness: There is abdominal tenderness. There is no right CVA tenderness or left CVA tenderness.     Hernia: No hernia (rectus diastasis) is present.   Musculoskeletal:     Thoracic back: No tenderness or bony tenderness.       Back:  Neurological:     Mental Status: He is alert.      Results for orders placed or performed in visit on 11/10/24  CBC  Result Value Ref Range   WBC 8.3 4.0 - 10.5 K/uL   RBC 5.02 4.22 - 5.81 Mil/uL   Platelets 239.0 150.0 - 400.0 K/uL   Hemoglobin 14.3 13.0 - 17.0 g/dL   HCT 57.7 60.9 - 47.9 %   MCV 84.0 78.0 - 100.0 fl   MCHC 33.9 30.0 - 36.0 g/dL   RDW 84.6 88.4 - 84.4 %  Comprehensive metabolic panel with GFR  Result Value Ref Range   Sodium 139 135 - 145 mEq/L    Potassium 4.3 3.5 - 5.1 mEq/L   Chloride 102 96 - 112 mEq/L   CO2 27 19 - 32 mEq/L   Glucose, Bld 142 (H) 70 - 99 mg/dL   BUN 20 6 - 23 mg/dL   Creatinine, Ser 8.77 0.40 - 1.50 mg/dL   Total Bilirubin 0.7 0.2 - 1.2 mg/dL   Alkaline Phosphatase 63 39 - 117 U/L   AST 18 5 - 37 U/L   ALT 24 3 - 53 U/L   Total Protein 7.5 6.0 - 8.3 g/dL   Albumin 4.4 3.5 - 5.2 g/dL   GFR 41.49 (L) >39.99 mL/min   Calcium  9.3 8.4 - 10.5 mg/dL  Lipase  Result Value Ref Range   Lipase 59.0 11.0 - 59.0 U/L      The ASCVD Risk score (Arnett DK, et al., 2019) failed to calculate for the following reasons:   The valid total cholesterol range is 130 to 320 mg/dL    Assessment & Plan:   Problem List Items Addressed This Visit       Cardiovascular and Mediastinum   Hypertension associated with diabetes (HCC)   Relevant Orders   EKG 12-Lead (Completed)     Endocrine   Type 2 diabetes mellitus with stage 2 chronic kidney disease, with long-term current use of insulin  (HCC)   Relevant Orders   EKG 12-Lead (Completed)   Other Visit Diagnoses       Periumbilical abdominal pain    -  Primary   Relevant Orders   CBC (Completed)   Comprehensive metabolic panel with GFR (Completed)   Lipase (Completed)   DG Abd 1 View (Completed)     Acute midline thoracic back pain       Relevant Orders   EKG 12-Lead (Completed)      Assessment and Plan Assessment & Plan Abdominal pain with nausea and vomiting, evaluating for hernia or drug effect Intermittent sharp abdominal pain with nausea and vomiting. Differential includes Ozempic  side effects, hernia, or gastrointestinal issues. No kidney stones or gallbladder issues. Tenderness in mid-abdomen and rectus diastasis noted. - Ordered blood work for liver and pancreas function. - Ordered abdominal x-ray for stool burden or abnormalities. - Performed EKG to rule out cardiac causes. - Consider CT scan if x-ray inconclusive.  Type 2 diabetes mellitus with  stage 2 chronic kidney disease Improved glycemic control with A1c reduced from 10.3% to 7.4%, per cgm. Weight loss noted. Nocturnal insulin  discontinued due to hypoglycemia risk. Ozempic  may contribute to gastrointestinal symptoms. - Continue current diabetes  management regimen.  Constipation due to antidiabetic medication Constipation managed with Miralax . Daily bowel movements without difficulty. Adequate fluid intake reported. - Ensure adequate hydration.  Return if symptoms worsen or fail to improve, for As scheduled.    Adina Crandall, NP  "

## 2024-11-11 ENCOUNTER — Ambulatory Visit: Payer: Self-pay | Admitting: Nurse Practitioner

## 2024-11-11 DIAGNOSIS — R1031 Right lower quadrant pain: Secondary | ICD-10-CM

## 2024-11-11 NOTE — Telephone Encounter (Signed)
-----   Message from Bobbette Sprague, CMA sent at 11/11/2024  9:35 AM EST -----

## 2024-11-11 NOTE — Telephone Encounter (Signed)
 Copied from CRM [id number]. Topic: Referral - Question >> Nov 11, 2024  9:11 AM Revonda D wrote: Reason for CRM: Pt wanted to inform Lynwood Crandall, NP, that he prefers to go to a [place] location for his CT scan. Pt would like a callback with an update on the referral.

## 2024-11-11 NOTE — Addendum Note (Signed)
 Addended by: WENDEE LYNWOOD HERO on: 11/11/2024 01:49 PM   Modules accepted: Orders

## 2024-11-11 NOTE — Telephone Encounter (Signed)
 I have placed a stat ct scan for [name] [name] in [place]

## 2024-11-12 ENCOUNTER — Ambulatory Visit
Admission: RE | Admit: 2024-11-12 | Discharge: 2024-11-12 | Disposition: A | Discharge status: Home / Self Care | Payer: Medicare (Managed Care) | Source: Ambulatory Visit | Attending: Nurse Practitioner | Admitting: Nurse Practitioner

## 2024-11-12 DIAGNOSIS — R1031 Right lower quadrant pain: Secondary | ICD-10-CM | POA: Diagnosis present

## 2024-11-12 MED ORDER — IOHEXOL 300 MG/ML  SOLN
100.0000 mL | Freq: Once | INTRAMUSCULAR | Status: AC | PRN
  Administered 2024-11-12: 100 mL via INTRAVENOUS

## 2024-11-13 ENCOUNTER — Ambulatory Visit: Payer: Self-pay | Admitting: Nurse Practitioner

## 2024-11-18 ENCOUNTER — Ambulatory Visit: Payer: Medicare (Managed Care) | Admitting: Podiatry

## 2024-11-28 ENCOUNTER — Other Ambulatory Visit: Payer: Self-pay | Admitting: Nurse Practitioner

## 2024-11-28 DIAGNOSIS — E1159 Type 2 diabetes mellitus with other circulatory complications: Secondary | ICD-10-CM

## 2024-12-02 ENCOUNTER — Other Ambulatory Visit: Payer: Self-pay | Admitting: Nurse Practitioner

## 2024-12-02 ENCOUNTER — Ambulatory Visit: Payer: Medicare (Managed Care) | Admitting: Nurse Practitioner

## 2024-12-02 ENCOUNTER — Encounter: Payer: Self-pay | Admitting: Nurse Practitioner

## 2024-12-02 VITALS — BP 124/60 | HR 80 | Temp 98.1°F | Ht 71.0 in | Wt 219.8 lb

## 2024-12-02 DIAGNOSIS — R051 Acute cough: Secondary | ICD-10-CM

## 2024-12-02 DIAGNOSIS — J3089 Other allergic rhinitis: Secondary | ICD-10-CM

## 2024-12-02 DIAGNOSIS — R0981 Nasal congestion: Secondary | ICD-10-CM

## 2024-12-02 DIAGNOSIS — R0982 Postnasal drip: Secondary | ICD-10-CM

## 2024-12-02 DIAGNOSIS — R067 Sneezing: Secondary | ICD-10-CM

## 2024-12-02 LAB — POC COVID19 BINAXNOW: SARS Coronavirus 2 Ag: NEGATIVE

## 2024-12-02 MED ORDER — FLUTICASONE PROPIONATE 50 MCG/ACT NA SUSP: 2.0000 | Freq: Every day | NASAL | 0 refills | Status: AC

## 2024-12-02 MED ORDER — CETIRIZINE HCL 10 MG PO TABS: 10.0000 mg | ORAL_TABLET | Freq: Every day | ORAL | 0 refills | Status: AC

## 2024-12-02 NOTE — Progress Notes (Signed)
 "  Established Patient Office Visit  Subjective   Patient ID: Marcellas Marchant, male    DOB: 1951/05/11  Age: 74 y.o. MRN: [id number]  Chief Complaint  Patient presents with   Nasal Congestion    Pt complains of runny nose, sneezing, nasal pressure, and chest congestion and a slight cough for a few days. No body aches or chills.     HPI   With a history of HTN, DM2, CKD,  Flu vaccine: 07/10/2024 Covid Vaccine: original with boosters last booster 07/16/2024  Discussed the use of AI scribe software for clinical note transcription with the patient, who gave verbal consent to proceed.  History of Present Illness Bodhi Moradi is a 74 year old male who presents with persistent upper respiratory symptoms.  He has been experiencing upper respiratory symptoms for about a week, including rhinorrhea and frequent sneezing, sometimes three or four times in a row. He describes a sensation of fullness in his chest, which he attributes to congestion and postnasal drip. He has been coughing, producing clear mucus, and notes that the nasal discharge is also clear.  He has attempted to manage his symptoms with Benadryl , but reports no improvement in either the rhinorrhea or sneezing. He also took extra strength Tylenol . In December, he had an upper respiratory infection for which he visited urgent care and was tested for COVID, which was negative at that time.  No fever, chills, shortness of breath, new body aches, nausea, vomiting, diarrhea, or stomach pain. Initially, he experienced a sore throat, which has since resolved. He reports a little tiredness but no significant fatigue. He received his flu shot and most recent COVID booster in September.  He has allergies to azithromycin, doxycycline, and vancomycin.      Review of Systems  Constitutional:  Positive for malaise/fatigue. Negative for chills and fever.  HENT:  Positive for congestion, ear pain and sinus pain. Negative for sore throat.        +  sneezing   Respiratory:  Positive for cough and sputum production. Negative for shortness of breath.   Cardiovascular:  Negative for chest pain.  Gastrointestinal:  Negative for abdominal pain, constipation, diarrhea, nausea and vomiting.  Neurological:  Negative for headaches.  Psychiatric/Behavioral:  Negative for hallucinations and suicidal ideas.       Objective:     BP 124/60   Pulse 80   Temp 98.1 F (36.7 C) (Oral)   Ht 5' 11 (1.803 m)   Wt 219 lb 12.8 oz (99.7 kg)   SpO2 99%   BMI 30.66 kg/m    Physical Exam Vitals and nursing note reviewed.  Constitutional:      Appearance: Normal appearance.  HENT:     Right Ear: Tympanic membrane, ear canal and external ear normal.     Left Ear: Ear canal and external ear normal. There is impacted cerumen.     Nose:     Right Sinus: No maxillary sinus tenderness or frontal sinus tenderness.     Left Sinus: No maxillary sinus tenderness or frontal sinus tenderness.     Mouth/Throat:     Mouth: Mucous membranes are moist.     Pharynx: Oropharynx is clear.  Cardiovascular:     Rate and Rhythm: Normal rate and regular rhythm.     Heart sounds: Normal heart sounds.  Pulmonary:     Effort: Pulmonary effort is normal.     Breath sounds: Normal breath sounds.  Lymphadenopathy:     Cervical: No cervical adenopathy.  Neurological:     Mental Status: He is alert.      Results for orders placed or performed in visit on 12/02/24  POC COVID-19 BinaxNow  Result Value Ref Range   SARS Coronavirus 2 Ag Negative Negative      The ASCVD Risk score (Arnett DK, et al., 2019) failed to calculate for the following reasons:   The valid total cholesterol range is 130 to 320 mg/dL    Assessment & Plan:   Problem List Items Addressed This Visit   None Visit Diagnoses       Acute cough    -  Primary   Relevant Medications   fluticasone  (FLONASE ) 50 MCG/ACT nasal spray   Other Relevant Orders   POC COVID-19 BinaxNow (Completed)      Nasal congestion       Relevant Orders   POC COVID-19 BinaxNow (Completed)     Sneezing       Relevant Medications   cetirizine  (ZYRTEC ) 10 MG tablet     PND (post-nasal drip)       Relevant Medications   fluticasone  (FLONASE ) 50 MCG/ACT nasal spray     Non-seasonal allergic rhinitis, unspecified trigger          Assessment and Plan Assessment & Plan Allergic rhinitis Symptoms of rhinorrhea, sneezing, and postnasal drip for one week. Negative COVID test. Symptoms less likely infectious. Differential includes upper respiratory infection, but less likely due to symptomatology. - Prescribed nasal spray, two sprays each nostril daily, to dry nasal passages and alleviate rhinorrhea and postnasal drip. Discussed risk of epistaxis with nasal dryness. - Prescribed antihistamine for 30 days, to be taken daily, preferably at night. Discussed potential drowsiness as a side effect. - Will follow up on Friday to assess symptom improvement and determine need for antibiotics.   Return if symptoms worsen or fail to improve, for as scheduled .    Adina Crandall, NP  "

## 2024-12-02 NOTE — Patient Instructions (Addendum)
 Nice to see you today  The covid test was negative Follow up if you do not improve or as scheduled

## 2024-12-04 ENCOUNTER — Telehealth: Payer: Self-pay | Admitting: Nurse Practitioner

## 2024-12-04 NOTE — Telephone Encounter (Signed)
 Left detailed voicemail for patient to call the office back.

## 2024-12-04 NOTE — Telephone Encounter (Signed)
-----   Message from [facility] sent at 12/02/2024  9:13 AM EST ----- Regarding: Nasal congestion/cough Can we see how he is doing with the nasal spray and zyrtec ? Any improvement. If not consider abx but look at allergies

## 2024-12-04 NOTE — Telephone Encounter (Signed)
 Can we see how he is doing with the nasal spray and zyrtec ? Any improvement. If not consider abx but look at allergie

## 2024-12-07 ENCOUNTER — Ambulatory Visit: Payer: Medicare (Managed Care) | Admitting: Podiatry

## 2024-12-11 ENCOUNTER — Ambulatory Visit: Admitting: Nurse Practitioner

## 2025-05-31 ENCOUNTER — Ambulatory Visit
